# Patient Record
Sex: Female | Born: 1985 | Race: White | Hispanic: No | Marital: Married | Smoking: Never smoker
Health system: Southern US, Community
[De-identification: ages and names within clinical notes are randomized; demographics above are authoritative.]

## PROBLEM LIST (undated history)

## (undated) DIAGNOSIS — Z87442 Personal history of urinary calculi: Secondary | ICD-10-CM

## (undated) DIAGNOSIS — F419 Anxiety disorder, unspecified: Secondary | ICD-10-CM

## (undated) DIAGNOSIS — Z8619 Personal history of other infectious and parasitic diseases: Secondary | ICD-10-CM

## (undated) DIAGNOSIS — M549 Dorsalgia, unspecified: Secondary | ICD-10-CM

## (undated) HISTORY — PX: WISDOM TOOTH EXTRACTION: SHX21

## (undated) HISTORY — DX: Personal history of urinary calculi: Z87.442

## (undated) HISTORY — DX: Dorsalgia, unspecified: M54.9

## (undated) HISTORY — DX: Personal history of other infectious and parasitic diseases: Z86.19

---

## 2014-05-16 ENCOUNTER — Telehealth: Payer: Self-pay | Admitting: *Deleted

## 2014-05-16 ENCOUNTER — Ambulatory Visit (INDEPENDENT_AMBULATORY_CARE_PROVIDER_SITE_OTHER): Payer: BLUE CROSS/BLUE SHIELD

## 2014-05-16 ENCOUNTER — Ambulatory Visit (INDEPENDENT_AMBULATORY_CARE_PROVIDER_SITE_OTHER): Payer: BLUE CROSS/BLUE SHIELD | Admitting: Family Medicine

## 2014-05-16 VITALS — BP 110/66 | HR 83 | Temp 97.5°F | Resp 16 | Ht 67.5 in | Wt 198.4 lb

## 2014-05-16 DIAGNOSIS — Z3200 Encounter for pregnancy test, result unknown: Secondary | ICD-10-CM | POA: Diagnosis not present

## 2014-05-16 DIAGNOSIS — R05 Cough: Secondary | ICD-10-CM

## 2014-05-16 DIAGNOSIS — J209 Acute bronchitis, unspecified: Secondary | ICD-10-CM

## 2014-05-16 DIAGNOSIS — R059 Cough, unspecified: Secondary | ICD-10-CM

## 2014-05-16 LAB — POCT CBC
Granulocyte percent: 69.1 %G (ref 37–80)
HCT, POC: 43.4 % (ref 37.7–47.9)
Hemoglobin: 14.2 g/dL (ref 12.2–16.2)
Lymph, poc: 1.9 (ref 0.6–3.4)
MCH, POC: 27.9 pg (ref 27–31.2)
MCHC: 32.8 g/dL (ref 31.8–35.4)
MCV: 85.3 fL (ref 80–97)
MID (cbc): 0.4 (ref 0–0.9)
MPV: 8.5 fL (ref 0–99.8)
PLATELET COUNT, POC: 240 10*3/uL (ref 142–424)
POC Granulocyte: 5.2 (ref 2–6.9)
POC LYMPH PERCENT: 25 %L (ref 10–50)
POC MID %: 5.9 % (ref 0–12)
RBC: 5.09 M/uL (ref 4.04–5.48)
RDW, POC: 13.4 %
WBC: 7.5 10*3/uL (ref 4.6–10.2)

## 2014-05-16 LAB — D-DIMER, QUANTITATIVE (NOT AT ARMC): D DIMER QUANT: 0.27 ug{FEU}/mL (ref 0.00–0.48)

## 2014-05-16 LAB — POCT URINE PREGNANCY: Preg Test, Ur: NEGATIVE

## 2014-05-16 MED ORDER — HYDROCOD POLST-CHLORPHEN POLST 10-8 MG/5ML PO LQCR: 5.0000 mL | Freq: Two times a day (BID) | ORAL | Status: DC | PRN

## 2014-05-16 MED ORDER — AZITHROMYCIN 250 MG PO TABS: ORAL_TABLET | ORAL | Status: DC

## 2014-05-16 NOTE — Telephone Encounter (Signed)
Call report came in from [facility] regarding this pt.  D-dimer results are in Epic

## 2014-05-16 NOTE — Telephone Encounter (Signed)
Normal, called pt - she was very happy, will rtc if not improving.

## 2014-05-16 NOTE — Patient Instructions (Signed)

## 2014-05-16 NOTE — Progress Notes (Signed)
   Subjective:    Patient ID: Natalie Montgomery, female    DOB: 1985-12-08, 29 y.o.   MRN: [id number]  HPI    Review of Systems     Objective:  BP 110/66 mmHg  Pulse 83  Temp(Src) 97.5 F (36.4 C)  Resp 16  Ht 5' 7.5" (1.715 m)  Wt 198 lb 6.4 oz (89.994 kg)  BMI 30.60 kg/m2  SpO2 99%  LMP  (LMP Unknown)  Physical Exam       [facility] reading (PRIMARY) by  Dr. Clelia CroftShaw. CXR: no acute abnormality EKG: NSR, no ischemic changes Assessment & Plan:  Reviewed below resident note and discussed assessment and plan.  Agree w/ below.

## 2014-05-16 NOTE — Progress Notes (Signed)
   Subjective:    Patient ID: Natalie Montgomery, female    DOB: Oct 11, 1985, 29 y.o.   MRN: [id number]  HPI  Patient reports about 2 weeks of a "cold" with constant cough that began dry and is now becoming productive with greenish yellow lumps, no blood. She has had no dyspnea except when exertion makes her cough (walking yesterday). "Lungs" and right submandibular area hurt very badly x 4 days. Feels like there is a cut there that "rips" every time she swallows or coughs. She has had some subjective fever but that feels better today. Energy level is low. Is eating, drinking, urinating like normal. Denies diarrhea, nausea, vomiting, or abdominal pain. Tried nyquil and dayquil which upsets her stomach though it mildly helped with other symptoms. Has been around 652.30 year old (family member's cousin's child) who goes to daycare, though he had only mild cough. Has also had sneezing, rhinorrhea, neck pain. Denies neck stiffness or rash. No leg swelling, long car rides. She is on a [place] OCP. Sister died from PE.  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 NKDA.   Review of Systems - Per HPI     Objective:   Physical Exam BP 110/66 mmHg  Pulse 83  Temp(Src) 97.5 F (36.4 C)  Resp 16  Ht 5' 7.5" (1.715 m)  Wt 198 lb 6.4 oz (89.994 kg)  BMI 30.60 kg/m2  SpO2 99%  LMP  (LMP Unknown)  GEN: NAD, pleasant CV: RRR, no m/r/g, 2+ B radial pulses PULM: Mild RUL>RLL crackles, normal effort, occasional dry-sounding cough ABD: S/NT EXTR: No LE edema or calf tenderness HEENT: AT/[place], sclera clear, EOMI, PERRL, o/p clear with no purulence or erythema, TMs mildly hazy bilaterally with no bulging or obvious purulence, nares patent with mild dry rhinorrhea, neck supple, no obvious LAD but right submandibular area tenderness, increased maxillary sinus pressure to palpation    Assessment  & Plan:   49F with 2 weeks of cough that has become productive with rhinorrhea, mild sinus pain, fatigue/malaise and subjective fever. Sounds like possible viral URI that has developed into CAP now by symptoms and timecourse. Wells criteria is 0 but sister who died with PE and pt on OCP. CENTOR criteria 1 for tender LAD. No wheezes or hx to raise concern for asthma. - DG chest 2 view - POC CBC>>>WNL - EKG for any signs of strain - Will start patient on 5 day course of azithromycin and have her f/u in 3-5 days or sooner if any worsened shortness of breath.  - No PCP - Hydration, rest discussed.

## 2014-08-14 ENCOUNTER — Ambulatory Visit (INDEPENDENT_AMBULATORY_CARE_PROVIDER_SITE_OTHER): Payer: BLUE CROSS/BLUE SHIELD | Admitting: Physician Assistant

## 2014-08-14 VITALS — BP 112/74 | HR 90 | Temp 97.9°F | Resp 17 | Ht 68.0 in | Wt 191.2 lb

## 2014-08-14 DIAGNOSIS — J309 Allergic rhinitis, unspecified: Secondary | ICD-10-CM | POA: Diagnosis not present

## 2014-08-14 DIAGNOSIS — R0981 Nasal congestion: Secondary | ICD-10-CM

## 2014-08-14 DIAGNOSIS — J0100 Acute maxillary sinusitis, unspecified: Secondary | ICD-10-CM

## 2014-08-14 DIAGNOSIS — J029 Acute pharyngitis, unspecified: Secondary | ICD-10-CM

## 2014-08-14 DIAGNOSIS — L299 Pruritus, unspecified: Secondary | ICD-10-CM | POA: Diagnosis not present

## 2014-08-14 MED ORDER — AMOXICILLIN-POT CLAVULANATE 875-125 MG PO TABS: 1.0000 | ORAL_TABLET | Freq: Two times a day (BID) | ORAL | Status: DC

## 2014-08-14 NOTE — Patient Instructions (Signed)
For your allergies, please take claritin or zyrtec once daily for next few weeks. Please use flonase nasal spray two sprays in each nostril once daily for next few weeks.  You likely have a sinus infection at this time, please take the augmentin twice daily for 10 days. I've sent a throat culture and will let you know if there is anything concerning with this.  Allergic Rhinitis Allergic rhinitis is when the mucous membranes in the nose respond to allergens. Allergens are particles in the air that cause your body to have an allergic reaction. This causes you to release allergic antibodies. Through a chain of events, these eventually cause you to release histamine into the blood stream. Although meant to protect the body, it is this release of histamine that causes your discomfort, such as frequent sneezing, congestion, and an itchy, runny nose.  CAUSES  Seasonal allergic rhinitis (hay fever) is caused by pollen allergens that may come from grasses, trees, and weeds. Year-round allergic rhinitis (perennial allergic rhinitis) is caused by allergens such as house dust mites, pet dander, and mold spores.  SYMPTOMS   Nasal stuffiness (congestion).  Itchy, runny nose with sneezing and tearing of the eyes. DIAGNOSIS  Your health care provider can help you determine the allergen or allergens that trigger your symptoms. If you and your health care provider are unable to determine the allergen, skin or blood testing may be used. TREATMENT  Allergic rhinitis does not have a cure, but it can be controlled by:  Medicines and allergy shots (immunotherapy).  Avoiding the allergen. Hay fever may often be treated with antihistamines in pill or nasal spray forms. Antihistamines block the effects of histamine. There are over-the-counter medicines that may help with nasal congestion and swelling around the eyes. Check with your health care provider before taking or giving this medicine.  If avoiding the allergen  or the medicine prescribed do not work, there are many new medicines your health care provider can prescribe. Stronger medicine may be used if initial measures are ineffective. Desensitizing injections can be used if medicine and avoidance does not work. Desensitization is when a patient is given ongoing shots until the body becomes less sensitive to the allergen. Make sure you follow up with your health care provider if problems continue. HOME CARE INSTRUCTIONS It is not possible to completely avoid allergens, but you can reduce your symptoms by taking steps to limit your exposure to them. It helps to know exactly what you are allergic to so that you can avoid your specific triggers. SEEK MEDICAL CARE IF:   You have a fever.  You develop a cough that does not stop easily (persistent).  You have shortness of breath.  You start wheezing.  Symptoms interfere with normal daily activities. Document Released: 10/29/2000 Document Revised: 02/08/2013 Document Reviewed: 10/11/2012 [facility] Patient Information 2015 [place]. This information is not intended to replace advice given to you by your health care provider. Make sure you discuss any questions you have with your health care provider.

## 2014-08-14 NOTE — Progress Notes (Signed)
   Subjective:    Patient ID: Natalie Montgomery, female    DOB: 10/30/1985, 29 y.o.   MRN: [id number]  Chief Complaint  Patient presents with  . Sore Throat    so sore cant swallow, lump in throat   . Cough  . Nasal Congestion  . Ear Fullness    pa states they are clogged    There are no active problems to display for this patient.  Medications, allergies, past medical history, surgical history, family history, social history and problem list reviewed and updated.  HPI  [age] yof presents with above sx.   Has felt congested past couple wks. Head and ears. Past 4-5 days has had worsening st. Hurts to swallow. Denies cough. Felt warm past few days but did not check temp. Denies chills. Denies abd pain, n/v, diarrhea.   Has been taking ibuprofen at home but did not take today. Denies drooling, trouble breathing, able to tolerate foods and liquids ok. Denies sick contacts.   Does mention her ears have been itchy bilaterally. Denies itchy eyes.   Review of Systems See HPI.     Objective:   Physical Exam  Constitutional: She appears well-developed and well-nourished.  Non-toxic appearance. She does not have a sickly appearance. She does not appear ill. No distress.  BP 112/74 mmHg  Pulse 90  Temp(Src) 97.9 F (36.6 C) (Oral)  Resp 17  Ht 5\' 8"  (1.727 m)  Wt 191 lb 3.2 oz (86.728 kg)  BMI 29.08 kg/m2  SpO2 99%  LMP 01/17/2014   HENT:  Right Ear: A middle ear effusion is present.  Left Ear: A middle ear effusion is present.  Nose: Mucosal edema and rhinorrhea present. Right sinus exhibits maxillary sinus tenderness. Right sinus exhibits no frontal sinus tenderness. Left sinus exhibits maxillary sinus tenderness. Left sinus exhibits no frontal sinus tenderness.  Mouth/Throat: Uvula is midline, oropharynx is clear and moist and mucous membranes are normal.  Severe ttp bilateral maxillary sinuses  Pulmonary/Chest: Effort normal and breath sounds normal. No tachypnea.    Lymphadenopathy:       Head (right side): Submandibular adenopathy present.       Head (left side): Submandibular adenopathy present.    She has cervical adenopathy.       Right cervical: Superficial cervical adenopathy present.       Left cervical: Superficial cervical adenopathy present.      Assessment & Plan:   [age] yof presents with above sx.   Sore throat - Plan: Throat culture Loney Loh([facility]) Allergic rhinitis, unspecified allergic rhinitis type Head congestion Ear itching Acute maxillary sinusitis, recurrence not specified - Plan: amoxicillin-clavulanate (AUGMENTIN) 875-125 MG per tablet --suspect congestion, itching from poorly treated allergic rhinits --> zyrtec-d, flonase --sinus congestion for 2 wks with severe sinus ttp --> tx with augmentin for sinusitis --throat exam normal but cx sent to ensure no atypical pathology in this 29 yo pt  Donnajean Lopesodd M. Court Gracia, PA-C Physician Assistant-Certified [facility]  08/15/2014 8:42 AM

## 2014-08-15 LAB — CULTURE, GROUP A STREP: ORGANISM ID, BACTERIA: NORMAL

## 2015-08-04 DIAGNOSIS — F410 Panic disorder [episodic paroxysmal anxiety] without agoraphobia: Secondary | ICD-10-CM | POA: Diagnosis not present

## 2015-08-27 DIAGNOSIS — F410 Panic disorder [episodic paroxysmal anxiety] without agoraphobia: Secondary | ICD-10-CM | POA: Diagnosis not present

## 2015-09-11 ENCOUNTER — Emergency Department (HOSPITAL_COMMUNITY): Payer: BLUE CROSS/BLUE SHIELD

## 2015-09-11 ENCOUNTER — Encounter (HOSPITAL_COMMUNITY): Payer: Self-pay | Admitting: Emergency Medicine

## 2015-09-11 ENCOUNTER — Emergency Department (HOSPITAL_COMMUNITY)
Admission: EM | Admit: 2015-09-11 | Discharge: 2015-09-11 | Disposition: A | Discharge status: Home / Self Care | Payer: BLUE CROSS/BLUE SHIELD | Attending: Emergency Medicine | Admitting: Emergency Medicine

## 2015-09-11 DIAGNOSIS — M545 Low back pain: Secondary | ICD-10-CM | POA: Diagnosis not present

## 2015-09-11 DIAGNOSIS — R339 Retention of urine, unspecified: Secondary | ICD-10-CM | POA: Insufficient documentation

## 2015-09-11 LAB — URINE MICROSCOPIC-ADD ON: WBC, UA: NONE SEEN WBC/hpf (ref 0–5)

## 2015-09-11 LAB — URINALYSIS, ROUTINE W REFLEX MICROSCOPIC
BILIRUBIN URINE: NEGATIVE
Glucose, UA: NEGATIVE mg/dL
KETONES UR: NEGATIVE mg/dL
Leukocytes, UA: NEGATIVE
Nitrite: NEGATIVE
PROTEIN: NEGATIVE mg/dL
Specific Gravity, Urine: 1.015 (ref 1.005–1.030)
pH: 7 (ref 5.0–8.0)

## 2015-09-11 LAB — POC URINE PREG, ED: PREG TEST UR: NEGATIVE

## 2015-09-11 MED ORDER — CYCLOBENZAPRINE HCL 10 MG PO TABS: 10.0000 mg | ORAL_TABLET | Freq: Three times a day (TID) | ORAL | 0 refills | Status: DC | PRN

## 2015-09-11 MED ORDER — KETOROLAC TROMETHAMINE 60 MG/2ML IM SOLN
30.0000 mg | Freq: Once | INTRAMUSCULAR | Status: AC
Start: 2015-09-11 — End: 2015-09-11
  Administered 2015-09-11: 30 mg via INTRAMUSCULAR
  Filled 2015-09-11: qty 2

## 2015-09-11 MED ORDER — DEXAMETHASONE SODIUM PHOSPHATE 10 MG/ML IJ SOLN
10.0000 mg | Freq: Once | INTRAMUSCULAR | Status: AC
  Administered 2015-09-11: 10 mg via INTRAMUSCULAR
  Filled 2015-09-11: qty 1

## 2015-09-11 MED ORDER — MELOXICAM 15 MG PO TABS: 15.0000 mg | ORAL_TABLET | Freq: Every day | ORAL | 0 refills | Status: DC

## 2015-09-11 NOTE — ED Notes (Signed)
Family at bedside. 

## 2015-09-11 NOTE — ED Notes (Signed)
Pt verbalized understanding of discharge instructions and follow-up care. Denies further questions at this time. Ambulated without difficulty to the lobby.

## 2015-09-11 NOTE — Discharge Instructions (Signed)
Take the meloxicam and Flexeril as prescribed. Be sure to eat while taking meloxicam as it can be hard in your stomach. Do not drive or operate machinery while taking Flexeril. Do not drink alcohol while taking Flexeril. Follow up with a primary care provider or back here at the emergency department if your symptoms do not improve. Return to emergency department if you experience numbness/tingling, weakness, loss of bowel or bladder function, fevers or any other concerning symptoms.

## 2015-09-11 NOTE — ED Triage Notes (Signed)
Pt c/o severe lower back pain and urinary retention today

## 2015-09-11 NOTE — ED Provider Notes (Signed)
[facility]-EMERGENCY DEPT Provider Note   CSN: [id number] Arrival date & time: 09/11/15  1804  First Provider Contact:  First MD Initiated Contact with Patient 09/11/15 2100        History   Chief Complaint Chief Complaint  Patient presents with  . Back Pain  . Urinary Retention    HPI Natalie Montgomery is a 30 y.o. female.  HPI  Patient is a 30 year old female with a history of chronic back pain who presents the ED with sudden onset lower back pain since yesterday afternoon. She states was mowing the grass and turned around and felt a sudden pain in her lower back and sacrum that radiates into her bilateral lateral legs. At rest the pain is achy, 6/10. Movements make the pain worse which is sharp/stabbing, 8/10. Patient tried to tramadol and Flexeril with no relief. Patient states she had difficulty urinating. She urinated once today at noon and again this evening around 9:00. Associated nausea. Patient denies numbness/tingling, weakness, saddle anesthesia, loss of bowel or bladder function, fever, chills, nausea, vomiting, abdominal pain, headache, dizziness.  Patient states she had imaging done in [place] but has not had an MRI of her back.  History reviewed. No pertinent past medical history.  There are no active problems to display for this patient.   History reviewed. No pertinent surgical history.  OB History    No data available       Home Medications    Prior to Admission medications   Medication Sig Start Date End Date Taking? Authorizing Provider  ibuprofen (ADVIL,MOTRIN) 200 MG tablet Take 200 mg by mouth every 6 (six) hours as needed.   Yes Historical Provider, MD  Levonorgestrel (SKYLA) 13.5 MG IUD by Intrauterine route.   Yes Historical Provider, MD  traMADol (ULTRAM) 50 MG tablet Take 100 mg by mouth once.   Yes Historical Provider, MD  cyclobenzaprine (FLEXERIL) 10 MG tablet Take 1 tablet (10 mg total) by mouth 3 (three) times daily as needed for muscle spasms.  09/11/15   Jerre Simon, PA  meloxicam (MOBIC) 15 MG tablet Take 1 tablet (15 mg total) by mouth daily. 09/11/15   Jerre Simon, PA    Family History Family History  Problem Relation Age of Onset  . Hypertension Mother   . Hypertension Father   . Diabetes Maternal Grandmother   . Heart disease Maternal Grandfather     Social History Social History  Substance Use Topics  . Smoking status: Never Smoker  . Smokeless tobacco: Not on file  . Alcohol use 0.6 oz/week    1 Standard drinks or equivalent per week     Allergies   Review of patient's allergies indicates no known allergies.   Review of Systems Review of Systems  Constitutional: Negative for chills and fever.  Respiratory: Negative for shortness of breath.   Cardiovascular: Negative for chest pain.  Gastrointestinal: Negative for abdominal pain, nausea and vomiting.  Genitourinary: Positive for decreased urine volume and difficulty urinating. Negative for dysuria, flank pain, hematuria and vaginal discharge.  Musculoskeletal: Positive for back pain. Negative for neck pain and neck stiffness.  Skin: Negative for rash.  Neurological: Negative for dizziness, weakness, numbness and headaches.     Physical Exam Updated Vital Signs BP 101/64   Pulse 65   Temp 98.1 F (36.7 C) (Oral)   Resp 20   Ht 5\' 7"  (1.702 m)   Wt 86.2 kg   SpO2 97%   BMI 29.76 kg/m  Physical Exam  Physical Exam  Constitutional: Pt appears well-developed and well-nourished. No distress.  HENT:  Head: Normocephalic and atraumatic.  Mouth/Throat: Oropharynx is clear and moist. No oropharyngeal exudate.  Eyes: Conjunctivae are normal.  Neck: Normal range of motion. Neck supple.  Full ROM without pain  Cardiovascular: Normal rate, regular rhythm and intact distal pulses including 2+ DP pulses.   Pulmonary/Chest: Effort normal and breath sounds normal. No respiratory distress. Pt has no wheezes.  Abdominal: Soft. Normal bowel sounds,  Pt exhibits no distension. There is no tenderness.  Musculoskeletal:  Full range of motion of the T-spine and L-spine No tenderness to palpation of the spinous processes of the T-spine or L-spine Mild tenderness to palpation of the paraspinous muscles of the L-spine  and the SI joints bilaterally Pain elicited with straight leg raise on the right side, full range of motion of the hips and knees, patient's or vascular intact distally  Speech is clear and goal oriented, follows commands Normal 5/5 strength in lower extremities bilaterally including dorsiflexion and plantar flexion  Sensation normal to light touch Moves extremities without ataxia, coordination intact, negative pronator drift  Normal gait Normal balance No Clonus  Skin: Skin is warm and dry. No rash noted. Pt is not diaphoretic. No erythema.  Psychiatric: Pt has a normal mood and affect. Behavior is normal.  Nursing note and vitals reviewed.    ED Treatments / Results  Labs (all labs ordered are listed, but only abnormal results are displayed) Labs Reviewed  URINALYSIS, ROUTINE W REFLEX MICROSCOPIC (NOT AT [facility]) - Abnormal; Notable for the following:       Result Value   APPearance CLOUDY (*)    Hgb urine dipstick TRACE (*)    All other components within normal limits  URINE MICROSCOPIC-ADD ON - Abnormal; Notable for the following:    Squamous Epithelial / LPF 6-30 (*)    Bacteria, UA RARE (*)    All other components within normal limits  POC URINE PREG, ED  POC URINE PREG, ED    EKG  EKG Interpretation None       Radiology Dg Lumbar Spine Complete  Result Date: 09/11/2015 CLINICAL DATA:  Chronic low back pain radiates down right leg. Worsened yesterday. EXAM: LUMBAR SPINE - COMPLETE 4+ VIEW COMPARISON:  None. FINDINGS: There is no evidence of lumbar spine fracture. Alignment is normal. Intervertebral disc spaces are maintained. IMPRESSION: Negative. Electronically Signed   By: Kennith Center M.D.   On:  09/11/2015 21:52   Procedures Procedures (including critical care time)  Medications Ordered in ED Medications  ketorolac (TORADOL) injection 30 mg (30 mg Intramuscular Given 09/11/15 2204)  dexamethasone (DECADRON) injection 10 mg (10 mg Intramuscular Given 09/11/15 2336)     Initial Impression / Assessment and Plan / ED Course  I have reviewed the triage vital signs and the nursing notes.  Pertinent labs & imaging results that were available during my care of the patient were reviewed by me and considered in my medical decision making (see chart for details).  Clinical Course   11:15pm Pt pain improved with Toradol. Will give a dose of IM Decadron and d/c home   Final Clinical Impressions(s) / ED Diagnoses   Final diagnoses:  Bilateral low back pain, with sciatica presence unspecified  Urinary retention   Patient with back pain similar to episodes she's had in the past.  No neurological deficits and normal neuro exam.  Patient can walk but states is painful.  No loss  of bowel or bladder control. Patient did endorse urinary retention. She did void while in the ED and her bladder scan was roughly around 160 prior to voiding.  No concern for cauda equina.  No fever, night sweats, weight loss, h/o cancer, IVDU.  X-rays negative for any acute abnormality. RICE protocol and pain medicine indicated and discussed with patient. Patient pain control the ED with IM Toradol. We'll administer a dose of IM Decadron and discharged patient with pain medication of Flexeril. Instructed the patient to obtain a primary care provider and follow up with them regarding her back pain. Also suggested she see a spine specialist for her chronic back pain. Discussed strict return precautions. Patient and family expressed understanding to the discharge instructions.   New Prescriptions Discharge Medication List as of 09/11/2015 11:27 PM    START taking these medications   Details  meloxicam (MOBIC) 15 MG  tablet Take 1 tablet (15 mg total) by mouth daily., Starting Tue 09/11/2015, Print         Joyce Copa [place], [place] 09/11/15 4540    Rolan Bucco, MD 09/11/15 2359

## 2015-11-05 ENCOUNTER — Encounter: Payer: Self-pay | Admitting: Family

## 2015-11-05 ENCOUNTER — Ambulatory Visit (INDEPENDENT_AMBULATORY_CARE_PROVIDER_SITE_OTHER): Payer: BLUE CROSS/BLUE SHIELD | Admitting: Family

## 2015-11-05 VITALS — BP 110/80 | HR 89 | Temp 98.5°F | Resp 16 | Ht 67.0 in | Wt 210.0 lb

## 2015-11-05 DIAGNOSIS — Z23 Encounter for immunization: Secondary | ICD-10-CM | POA: Diagnosis not present

## 2015-11-05 DIAGNOSIS — M461 Sacroiliitis, not elsewhere classified: Secondary | ICD-10-CM | POA: Insufficient documentation

## 2015-11-05 MED ORDER — IBUPROFEN-FAMOTIDINE 800-26.6 MG PO TABS: 1.0000 | ORAL_TABLET | Freq: Three times a day (TID) | ORAL | 1 refills | Status: DC | PRN

## 2015-11-05 MED ORDER — DICLOFENAC SODIUM 2 % TD SOLN: 1.0000 "application " | Freq: Two times a day (BID) | TRANSDERMAL | 1 refills | Status: DC | PRN

## 2015-11-05 NOTE — Patient Instructions (Signed)
Thank you for choosing [facility]!  Ice / moist heat x 20 minutes every 2 hours and as needed or following activity  Pennsaid - Approximately 1/2 packet to the affected site twice daily.  Duexis - 1 tablet 3 times per day for the next 5-7 days and then as needed.  Exercises 1-2 times per day as instructed.   You will receive a call from [facility] regarding your Pennsaid/Duexis/Vimovo. The medication will be mailed to you and should cost you no more than $10 per item or possibly free depending upon your insurance.   Your prescription(s) have been submitted to your pharmacy or been printed and provided for you. Please take as directed and contact our office if you believe you are having problem(s) with the medication(s) or have any questions.  If your symptoms worsen or fail to improve, please contact our office for further instruction, or in case of emergency go directly to the emergency room at the closest medical facility.   Sacroiliac Joint Dysfunction Sacroiliac joint dysfunction is a condition that causes inflammation on one or both sides of the sacroiliac (SI) joint. The SI joint connects the lower part of the spine (sacrum) with the two upper portions of the pelvis (ilium). This condition causes deep aching or burning pain in the low back. In some cases, the pain may also spread into one or both buttocks or hips or spread down the legs. CAUSES This condition may be caused by:  Pregnancy. During pregnancy, extra stress is put on the SI joints because the pelvis widens.  Injury, such as:  Car accidents.  Sport-related injuries.  Work-related injuries.  Having one leg that is shorter than the other.  Conditions that affect the joints, such as:  Rheumatoid arthritis.  Gout.  Psoriatic arthritis.  Joint infection (septic arthritis). Sometimes, the cause of SI joint dysfunction is not known. SYMPTOMS Symptoms of this condition include:  Aching or burning  pain in the lower back. The pain may also spread to other areas, such as:  Buttocks.  Groin.  Thighs and legs.  Muscle spasms in or around the painful areas.  Increased pain when standing, walking, running, stair climbing, bending, or lifting. DIAGNOSIS Your health care provider will do a physical exam and take your medical history. During the exam, the health care provider may move one or both of your legs to different positions to check for pain. Various tests may be done to help verify the diagnosis, including:  Imaging tests to look for other causes of pain. These may include:  MRI.  CT scan.  Bone scan.  Diagnostic injection. A numbing medicine is injected into the SI joint using a needle. If the pain is temporarily improved or stopped after the injection, this can indicate that SI joint dysfunction is the problem. TREATMENT Treatment may vary depending on the cause and severity of your condition. Treatment options may include:  Applying ice or heat to the lower back area. This can help to reduce pain and muscle spasms.  Medicines to relieve pain or inflammation or to relax the muscles.  Wearing a back brace (sacroiliac brace) to help support the joint while your back is healing.  Physical therapy to increase muscle strength around the joint and flexibility at the joint. This may also involve learning proper body positions and ways of moving to relieve stress on the joint.  Direct manipulation of the SI joint.  Injections of steroid medicine into the joint in order to reduce pain  and swelling.  Radiofrequency ablation to burn away nerves that are carrying pain messages from the joint.  Use of a device that provides electrical stimulation in order to reduce pain at the joint.  Surgery to put in screws and plates that limit or prevent joint motion. This is rare. HOME CARE INSTRUCTIONS  Rest as needed. Limit your activities as directed by your health care  provider.  Take medicines only as directed by your health care provider.  If directed, apply ice to the affected area:  Put ice in a plastic bag.  Place a towel between your skin and the bag.  Leave the ice on for 20 minutes, 2-3 times per day.  Use a heating pad or a moist heat pack as directed by your health care provider.  Exercise as directed by your health care provider or physical therapist.  Keep all follow-up visits as directed by your health care provider. This is important. SEEK MEDICAL CARE IF:  Your pain is not controlled with medicine.  You have a fever.  You have increasingly severe pain. SEEK IMMEDIATE MEDICAL CARE IF:  You have weakness, numbness, or tingling in your legs or feet.  You lose control of your bladder or bowel.   This information is not intended to replace advice given to you by your health care provider. Make sure you discuss any questions you have with your health care provider.   Document Released: 05/02/2008 Document Revised: 06/20/2014 Document Reviewed: 10/11/2013 Elsevier Interactive Patient Education [employer]

## 2015-11-05 NOTE — Assessment & Plan Note (Signed)
Symptoms and exam consistent with sacroiliac joint and pronation. Treat conservatively with ice/moist heat, home exercise therapy, and start Pennsaid and Duexis. Previous x-rays reviewed with no significant findings. Most likely related to muscle tightness. If symptoms worsen or do not improve consider physical therapy and further imaging if necessary.
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
## 2016-01-19 DIAGNOSIS — R05 Cough: Secondary | ICD-10-CM | POA: Diagnosis not present

## 2016-01-19 DIAGNOSIS — J069 Acute upper respiratory infection, unspecified: Secondary | ICD-10-CM | POA: Diagnosis not present

## 2016-04-23 DIAGNOSIS — M4716 Other spondylosis with myelopathy, lumbar region: Secondary | ICD-10-CM | POA: Diagnosis not present

## 2016-05-06 ENCOUNTER — Telehealth: Payer: Self-pay | Admitting: Family

## 2016-05-06 NOTE — Telephone Encounter (Signed)
TELEPHONE ADVICE RECORD [facility]  Patient Name: Natalie SlickerNNA Ben  DOB: 01-Apr-1985    Initial Comment Caller's hands are very cold, going purple.    Nurse Assessment  Nurse: Odis LusterBowers, RN, Bjorn Loserhonda Date/Time Lamount Cohen(Eastern Time): 05/06/2016 3:10:28 PM  Confirm and document reason for call. If symptomatic, describe symptoms. ---Caller's hands are very cold, going purple. Reports that this has been happening for a couple of weeks. Denies swelling, but she has a hard time gripping things and they go numb as she works. 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 Has seen someone about her back who thinks that this might be coming from her neck.  Does the patient have any new or worsening symptoms? ---Yes  Will a triage be completed? ---Yes  Related visit to physician within the last 2 weeks? ---No  Does the PT have any chronic conditions? (i.e. diabetes, asthma, etc.) ---Yes  List chronic conditions. ---back/neck problems  Is the patient pregnant or possibly pregnant? (Ask all females between the ages of 6412-55) ---No  Is this a behavioral health or substance abuse call? ---No     Guidelines    Guideline Title Affirmed Question Affirmed Notes       Final Disposition User        Comments  Caller has a scheduled appt with Natalie Montgomery tomorrow at the [place] office at 3:30p.

## 2016-05-07 ENCOUNTER — Other Ambulatory Visit (INDEPENDENT_AMBULATORY_CARE_PROVIDER_SITE_OTHER): Payer: BLUE CROSS/BLUE SHIELD

## 2016-05-07 ENCOUNTER — Encounter: Payer: Self-pay | Admitting: Nurse Practitioner

## 2016-05-07 ENCOUNTER — Ambulatory Visit (INDEPENDENT_AMBULATORY_CARE_PROVIDER_SITE_OTHER): Payer: BLUE CROSS/BLUE SHIELD | Admitting: Nurse Practitioner

## 2016-05-07 VITALS — BP 128/84 | HR 84 | Temp 98.4°F | Ht 67.0 in | Wt 208.0 lb

## 2016-05-07 DIAGNOSIS — M5412 Radiculopathy, cervical region: Secondary | ICD-10-CM | POA: Diagnosis not present

## 2016-05-07 DIAGNOSIS — R202 Paresthesia of skin: Secondary | ICD-10-CM

## 2016-05-07 DIAGNOSIS — R200 Anesthesia of skin: Secondary | ICD-10-CM | POA: Diagnosis not present

## 2016-05-07 LAB — SEDIMENTATION RATE: Sed Rate: 5 mm/hr (ref 0–20)

## 2016-05-07 LAB — BASIC METABOLIC PANEL
BUN: 8 mg/dL (ref 6–23)
CALCIUM: 9.7 mg/dL (ref 8.4–10.5)
CHLORIDE: 104 meq/L (ref 96–112)
CO2: 29 meq/L (ref 19–32)
CREATININE: 0.56 mg/dL (ref 0.40–1.20)
GFR: 133.42 mL/min (ref >60.00)
Glucose, Bld: 93 mg/dL (ref 70–99)
Potassium: 3.7 mEq/L (ref 3.5–5.1)
Sodium: 138 mEq/L (ref 135–145)

## 2016-05-07 LAB — TSH: TSH: 0.93 u[IU]/mL (ref 0.35–4.50)

## 2016-05-07 NOTE — Progress Notes (Signed)
Subjective:  Patient ID: Natalie Montgomery, female    DOB: [birth year]  Age: 31 y.o. MRN: [id number]  CC: Hand Pain (both hand tension,cold,numbness,pain)   HPI Bilateral UE numbness and tingling: Ongoing for x 2 months, waxing and waning. Denies any head or neck injury. Symptoms are worse when laying down. Has not tried any treatment at this time. evaluated by neurosurgery 04/23/2016 who recommended MRI cervical spine. Pending pre cert.  Outpatient Medications Prior to Visit  Medication Sig Dispense Refill  . ibuprofen (ADVIL,MOTRIN) 200 MG tablet Take 200 mg by mouth every 6 (six) hours as needed.    . Ibuprofen-Famotidine 800-26.6 MG TABS Take 1 tablet by mouth 3 (three) times daily as needed. 90 tablet 1  . Levonorgestrel (SKYLA) 13.5 MG IUD by Intrauterine route.    . Diclofenac Sodium (PENNSAID) 2 % SOLN Place 1 application onto the skin 2 (two) times daily as needed. (Patient not taking: Reported on 05/07/2016) 112 g 1   No facility-administered medications prior to visit.     ROS See HPI  Objective:  BP 128/84   Pulse 84   Temp 98.4 F (36.9 C)   Ht '5\' 7"'$  (1.702 m)   Wt 208 lb (94.3 kg)   SpO2 99%   BMI 32.58 kg/m   BP Readings from Last 3 Encounters:  05/07/16 128/84  11/05/15 110/80  09/11/15 103/75    Wt Readings from Last 3 Encounters:  05/07/16 208 lb (94.3 kg)  11/05/15 210 lb (95.3 kg)  09/11/15 190 lb (86.2 kg)    Physical Exam  Constitutional: She is oriented to person, place, and time. No distress.  Eyes: No scleral icterus.  Neck: Normal range of motion. Neck supple. No thyromegaly present.  Cardiovascular: Normal rate and normal heart sounds.   Pulmonary/Chest: Effort normal and breath sounds normal.  Musculoskeletal: Normal range of motion. She exhibits no edema or tenderness.  Lymphadenopathy:    She has no cervical adenopathy.  Neurological: She is alert and oriented to person, place, and time. Coordination normal.  Skin: Skin is warm and  dry.  Vitals reviewed.   Lab Results  Component Value Date   WBC 7.5 05/16/2014   HGB 14.2 05/16/2014   HCT 43.4 05/16/2014   GLUCOSE 93 05/07/2016   NA 138 05/07/2016   K 3.7 05/07/2016   CL 104 05/07/2016   CREATININE 0.56 05/07/2016   BUN 8 05/07/2016   CO2 29 05/07/2016   TSH 0.93 05/07/2016    Dg Lumbar Spine Complete  Result Date: 09/11/2015 CLINICAL DATA:  Chronic low back pain radiates down right leg. Worsened yesterday. EXAM: LUMBAR SPINE - COMPLETE 4+ VIEW COMPARISON:  None. FINDINGS: There is no evidence of lumbar spine fracture. Alignment is normal. Intervertebral disc spaces are maintained. IMPRESSION: Negative. Electronically Signed   By: Misty Stanley M.D.   On: 09/11/2015 21:52   Assessment & Plan:   [name] was seen today for hand pain.  Diagnoses and all orders for this visit:  Cervical radiculopathy -     Antinuclear Antib (ANA); Future -     Rheumatoid Factor; Future -     Sed Rate (ESR); Future -     TSH; Future -     Basic metabolic panel; Future  Numbness and tingling in both hands -     Antinuclear Antib (ANA); Future -     Rheumatoid Factor; Future -     Sed Rate (ESR); Future -     TSH; Future -  Basic metabolic panel; Future   I am having Ms. Hamor maintain her ibuprofen, Levonorgestrel, Diclofenac Sodium, and Ibuprofen-Famotidine.  No orders of the defined types were placed in this encounter.   Follow-up: Return in about 1 month (around 06/07/2016) for forearm paresthesia (with greg calone).  Wilfred Lacy, NP

## 2016-05-07 NOTE — Patient Instructions (Signed)
Please follow up with neurosurgery: [facility] Barnett [facility]Henry [facility]  Will prescribe gabapentin if BMP normal.  You will be called with lab results.

## 2016-05-07 NOTE — Progress Notes (Signed)
Pre visit review using our clinic review tool, if applicable. No additional management support is needed unless otherwise documented below in the visit note. 

## 2016-05-08 LAB — ANA: Anti Nuclear Antibody(ANA): NEGATIVE

## 2016-05-08 LAB — RHEUMATOID FACTOR

## 2016-05-08 NOTE — Progress Notes (Signed)
Normal results, see office note

## 2016-05-09 DIAGNOSIS — M4802 Spinal stenosis, cervical region: Secondary | ICD-10-CM | POA: Diagnosis not present

## 2016-05-09 DIAGNOSIS — M4716 Other spondylosis with myelopathy, lumbar region: Secondary | ICD-10-CM | POA: Diagnosis not present

## 2016-05-15 DIAGNOSIS — G959 Disease of spinal cord, unspecified: Secondary | ICD-10-CM | POA: Diagnosis not present

## 2016-06-23 ENCOUNTER — Ambulatory Visit (INDEPENDENT_AMBULATORY_CARE_PROVIDER_SITE_OTHER): Payer: BLUE CROSS/BLUE SHIELD | Admitting: Internal Medicine

## 2016-06-23 DIAGNOSIS — J40 Bronchitis, not specified as acute or chronic: Secondary | ICD-10-CM

## 2016-06-23 MED ORDER — BENZONATATE 200 MG PO CAPS: 200.0000 mg | ORAL_CAPSULE | Freq: Three times a day (TID) | ORAL | 0 refills | Status: DC | PRN

## 2016-06-23 MED ORDER — HYDROCODONE-HOMATROPINE 5-1.5 MG/5ML PO SYRP: 5.0000 mL | ORAL_SOLUTION | Freq: Three times a day (TID) | ORAL | 0 refills | Status: DC | PRN

## 2016-06-23 MED ORDER — PREDNISONE 20 MG PO TABS: 40.0000 mg | ORAL_TABLET | Freq: Every day | ORAL | 0 refills | Status: DC

## 2016-06-23 NOTE — Progress Notes (Signed)
   Subjective:    Patient ID: Natalie Montgomery, female    DOB: 26-Mar-1985, 31 y.o.   MRN: [id number]  HPI The patient is a 31 YO female coming in for cold symptoms with cough and drainage. Started about 7-10 days ago and is overall worsening. She has tried otc cough medications which have not helped much. She is having some SOB and limitation of activity. Some fevers during this course. Also some fatigue. Overall symptoms worsening during the course. Denies ear pain. Having sinus pressure and headaches. No muscle aches or body aches.   Review of Systems  Constitutional: Positive for activity change, fatigue and fever. Negative for appetite change, chills and unexpected weight change.  HENT: Positive for congestion, postnasal drip, rhinorrhea and sinus pressure. Negative for ear discharge, ear pain, sinus pain, sore throat, trouble swallowing and voice change.   Eyes: Negative.   Respiratory: Positive for cough and shortness of breath. Negative for chest tightness and wheezing.   Cardiovascular: Negative.   Gastrointestinal: Negative.   Musculoskeletal: Negative.   Neurological: Negative.       Objective:   Physical Exam  Constitutional: She is oriented to person, place, and time. She appears well-developed and well-nourished.  HENT:  Head: Normocephalic and atraumatic.  Right Ear: External ear normal.  Left Ear: External ear normal.  Oropharynx with redness and drainage, nose with mild crusting, sinus pressure frontal.   Eyes: EOM are normal.  Neck: Normal range of motion.  Cardiovascular: Normal rate and regular rhythm.   Pulmonary/Chest: Effort normal. No respiratory distress. She has no wheezes. She has no rales.  Some rhonchi which partially clear with coughing  Abdominal: Soft.  Lymphadenopathy:    She has no cervical adenopathy.  Neurological: She is alert and oriented to person, place, and time.  Skin: Skin is warm and dry.   Vitals:   06/23/16 1549  BP: 140/78  Pulse: 98   Resp: 14  Temp: 98.7 F (37.1 C)  TempSrc: Oral  SpO2: 99%  Weight: 209 lb (94.8 kg)  Height: 5\' 7"  (1.702 m)      Assessment & Plan:

## 2016-06-23 NOTE — Patient Instructions (Signed)
We have sent in prednisone to take to clear the sinuses and lung. Take 2 pills daily for 5 days then stop.   We have sent in cough medicine to use at night time only.   We have also sent in tessalon perles that you can take up to 3 times per day which is non-drowsy.

## 2016-06-23 NOTE — Progress Notes (Signed)
Pre visit review using our clinic review tool, if applicable. No additional management support is needed unless otherwise documented below in the visit note. 

## 2016-06-24 DIAGNOSIS — J40 Bronchitis, not specified as acute or chronic: Secondary | ICD-10-CM | POA: Insufficient documentation

## 2016-06-24 NOTE — Assessment & Plan Note (Signed)
Rx for prednisone, tessalon perles and hycodan for symptom relief. Some rhonchi on exam with congestion.

## 2016-06-26 ENCOUNTER — Telehealth: Payer: Self-pay | Admitting: Internal Medicine

## 2016-06-26 MED ORDER — AZITHROMYCIN 250 MG PO TABS: ORAL_TABLET | ORAL | 0 refills | Status: DC

## 2016-06-26 NOTE — Telephone Encounter (Signed)
Azithromycin sent to [facility].

## 2016-06-26 NOTE — Telephone Encounter (Signed)
Patient states she seen Dr. Okey Duprerawford on 5/7 and was told to call back if not feeling any better.  Patient states she has gotten worse since then.  States she is still coughing through the night and is having ear pain.  Please follow up in regard.  Patient uses [facility] on [address].

## 2016-06-27 NOTE — Telephone Encounter (Signed)
Notified patient.

## 2016-09-03 ENCOUNTER — Telehealth: Payer: Self-pay | Admitting: *Deleted

## 2016-09-03 NOTE — Telephone Encounter (Signed)
Generally rabies vaccination would be the only thing to be concerned about if bitten. Otherwise there is nothing that is needed prophylactically.
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
## 2016-09-03 NOTE — Telephone Encounter (Signed)
Notified pt w/Greg response.../lmb 

## 2017-02-03 ENCOUNTER — Other Ambulatory Visit (INDEPENDENT_AMBULATORY_CARE_PROVIDER_SITE_OTHER): Payer: BLUE CROSS/BLUE SHIELD

## 2017-02-03 ENCOUNTER — Ambulatory Visit (INDEPENDENT_AMBULATORY_CARE_PROVIDER_SITE_OTHER)
Admission: RE | Admit: 2017-02-03 | Discharge: 2017-02-03 | Disposition: A | Discharge status: Home / Self Care | Payer: BLUE CROSS/BLUE SHIELD | Source: Ambulatory Visit | Attending: Internal Medicine | Admitting: Internal Medicine

## 2017-02-03 ENCOUNTER — Encounter: Payer: Self-pay | Admitting: Internal Medicine

## 2017-02-03 ENCOUNTER — Ambulatory Visit: Payer: Self-pay | Admitting: *Deleted

## 2017-02-03 ENCOUNTER — Ambulatory Visit: Payer: BLUE CROSS/BLUE SHIELD | Admitting: Internal Medicine

## 2017-02-03 VITALS — BP 120/80 | HR 89 | Temp 98.2°F | Ht 67.0 in | Wt 209.0 lb

## 2017-02-03 DIAGNOSIS — R42 Dizziness and giddiness: Secondary | ICD-10-CM

## 2017-02-03 DIAGNOSIS — M546 Pain in thoracic spine: Secondary | ICD-10-CM

## 2017-02-03 DIAGNOSIS — F419 Anxiety disorder, unspecified: Secondary | ICD-10-CM

## 2017-02-03 DIAGNOSIS — M549 Dorsalgia, unspecified: Secondary | ICD-10-CM | POA: Insufficient documentation

## 2017-02-03 DIAGNOSIS — R079 Chest pain, unspecified: Secondary | ICD-10-CM

## 2017-02-03 DIAGNOSIS — R0781 Pleurodynia: Secondary | ICD-10-CM | POA: Diagnosis not present

## 2017-02-03 LAB — BASIC METABOLIC PANEL
BUN: 9 mg/dL (ref 6–23)
CHLORIDE: 102 meq/L (ref 96–112)
CO2: 29 meq/L (ref 19–32)
CREATININE: 0.58 mg/dL (ref 0.40–1.20)
Calcium: 9.4 mg/dL (ref 8.4–10.5)
GFR: 127.53 mL/min (ref >60.00)
GLUCOSE: 88 mg/dL (ref 70–99)
Potassium: 3.9 mEq/L (ref 3.5–5.1)
Sodium: 138 mEq/L (ref 135–145)

## 2017-02-03 LAB — CBC WITH DIFFERENTIAL/PLATELET
BASOS ABS: 0.1 10*3/uL (ref 0.0–0.1)
Basophils Relative: 0.7 % (ref 0.0–3.0)
EOS PCT: 1.6 % (ref 0.0–5.0)
Eosinophils Absolute: 0.1 10*3/uL (ref 0.0–0.7)
HEMATOCRIT: 44.7 % (ref 36.0–46.0)
Hemoglobin: 15.3 g/dL — ABNORMAL HIGH (ref 12.0–15.0)
Lymphocytes Relative: 24.8 % (ref 12.0–46.0)
Lymphs Abs: 2 10*3/uL (ref 0.7–4.0)
MCHC: 34.3 g/dL (ref 30.0–36.0)
MCV: 85.3 fl (ref 78.0–100.0)
MONOS PCT: 7.5 % (ref 3.0–12.0)
Monocytes Absolute: 0.6 10*3/uL (ref 0.1–1.0)
Neutro Abs: 5.4 10*3/uL (ref 1.4–7.7)
Neutrophils Relative %: 65.4 % (ref 43.0–77.0)
Platelets: 266 10*3/uL (ref 150.0–400.0)
RBC: 5.23 Mil/uL — AB (ref 3.87–5.11)
RDW: 13.4 % (ref 11.5–15.5)
WBC: 8.2 10*3/uL (ref 4.0–10.5)

## 2017-02-03 LAB — HEPATIC FUNCTION PANEL
ALK PHOS: 35 U/L — AB (ref 39–117)
ALT: 23 U/L (ref 0–35)
AST: 15 U/L (ref 0–37)
Albumin: 4.5 g/dL (ref 3.5–5.2)
BILIRUBIN DIRECT: 0.1 mg/dL (ref 0.0–0.3)
BILIRUBIN TOTAL: 0.4 mg/dL (ref 0.2–1.2)
TOTAL PROTEIN: 8.2 g/dL (ref 6.0–8.3)

## 2017-02-03 MED ORDER — CYCLOBENZAPRINE HCL 5 MG PO TABS: 5.0000 mg | ORAL_TABLET | Freq: Three times a day (TID) | ORAL | 1 refills | Status: DC | PRN

## 2017-02-03 MED ORDER — ALPRAZOLAM 0.25 MG PO TABS: 0.2500 mg | ORAL_TABLET | Freq: Two times a day (BID) | ORAL | 0 refills | Status: DC | PRN

## 2017-02-03 MED ORDER — MELOXICAM 15 MG PO TABS: 15.0000 mg | ORAL_TABLET | Freq: Every day | ORAL | 3 refills | Status: DC | PRN

## 2017-02-03 NOTE — Telephone Encounter (Signed)
Patient is seeing Dr.JOHN today.

## 2017-02-03 NOTE — Patient Instructions (Addendum)
Your EKG was OK today  Please take all new medication as prescribed - the xanax as needed, and the antiinflammatory and muscle relaxer for pain  Please continue all other medications as before, and refills have been done if requested.  Please have the pharmacy call with any other refills you may need.  Please keep your appointments with your specialists as you may have planned  Please go to the XRAY Department in the Basement (go straight as you get off the elevator) for the x-ray testing  Please go to the LAB in the Basement (turn left off the elevator) for the tests to be done today  You will be contacted by phone if any changes need to be made immediately.  Otherwise, you will receive a letter about your results with an explanation, but please check with MyChart first.  Please remember to sign up for MyChart if you have not done so, as this will be important to you in the future with finding out test results, communicating by private email, and scheduling acute appointments online when needed.

## 2017-02-03 NOTE — Telephone Encounter (Signed)
Pt experiencing some stabbing chest pains that have been going on for a couple of weeks. She denies any cardiac symptoms. No distress voiced.  Appointment made  Reason for Disposition . [1] Chest pain lasting <= 5 minutes AND [2] NO chest pain or cardiac symptoms now(Exceptions: pains lasting a few seconds)  Answer Assessment - Initial Assessment Questions 1. LOCATION: "Where does it hurt?"       Towards the left side of chest 2. RADIATION: "Does the pain go anywhere else?" (e.g., into neck, jaw, arms, back)     Up toward the shoulder 3. ONSET: "When did the chest pain begin?" (Minutes, hours or days)      A couple of weeks 4. PATTERN "Does the pain come and go, or has it been constant since it started?"  "Does it get worse with exertion?"      Comes and goes. 5. DURATION: "How long does it last" (e.g., seconds, minutes, hours)     Stabbing pain lasts a few seconds and then the pain left can last up to an hour 6. SEVERITY: "How bad is the pain?"  (e.g., Scale 1-10; mild, moderate, or severe)    - MILD (1-3): doesn't interfere with normal activities     - MODERATE (4-7): interferes with normal activities or awakens from sleep    - SEVERE (8-10): excruciating pain, unable to do any normal activities       Moderate, #7 7. CARDIAC RISK FACTORS: "Do you have any history of heart problems or risk factors for heart disease?" (e.g., prior heart attack, angina; high blood pressure, diabetes, being overweight, high cholesterol, smoking, or strong family history of heart disease)     Dad and mom has HTN 8. PULMONARY RISK FACTORS: "Do you have any history of lung disease?"  (e.g., blood clots in lung, asthma, emphysema, birth control pills)     IUD, family hx of asthma, sister passed away from blood clot 9. CAUSE: "What do you think is causing the chest pain?"     Not sure 10. OTHER SYMPTOMS: "Do you have any other symptoms?" (e.g., dizziness, nausea, vomiting, sweating, fever, difficulty breathing,  cough)       no 11. PREGNANCY: "Is there any chance you are pregnant?" "When was your last menstrual period?"       No LMP last week  Protocols used: CHEST PAIN-A-AH

## 2017-02-03 NOTE — Assessment & Plan Note (Signed)
Suspect trelated to anxiety but ok for labs as ordered,  to f/u any worsening symptoms or concerns with expectant management, ecg reviewed

## 2017-02-03 NOTE — Assessment & Plan Note (Signed)
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

## 2017-02-03 NOTE — Progress Notes (Signed)
Subjective:    Patient ID: Natalie Montgomery, female    DOB: 1985/05/28, 31 y.o.   MRN: [id number]  HPI  Here with several complaints of 2-3 days stabbing sharp pleuritic left upper CP mild, intermittent without radiation, n/v, or diaphoresis.  Sister died at 32yo of DVT/PE and she is very concerned.  No fever, Pt denies wheezing, orthopnea, PND, increased LE edema, but has had mild palpitations, dizziness with anxiety.  Did have URi cold symptoms 2 wks ago but resolved and not using any otc meds.  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 Also c/o bilat costal margin pain with bending as well as bilat pain below the bilat scapulas in the back.   Past Medical History:  Diagnosis Date  . Back pain   . History of chicken pox   . History of kidney stones    No past surgical history on file.  reports that  has never smoked. she has never used smokeless tobacco. She reports that she drinks about 2.4 oz of alcohol per week. She reports that she does not use drugs. family history includes Diabetes in her mother; Heart disease in her maternal grandfather; Hypertension in her father and mother; Multiple sclerosis in her maternal grandmother; Pulmonary embolism in her sister. Allergies  Allergen Reactions  . Pecan Nut (Diagnostic)    Current Outpatient Medications on File Prior to Visit  Medication Sig Dispense Refill  . Diclofenac Sodium (PENNSAID) 2 % SOLN Place 1 application onto the skin 2 (two) times daily as needed. 112 g 1  . HYDROcodone-homatropine (HYCODAN) 5-1.5 MG/5ML syrup Take 5 mLs by mouth every 8 (eight) hours as needed for cough. 120 mL 0  . Ibuprofen-Famotidine 800-26.6 MG TABS Take 1 tablet by mouth 3 (three) times daily as needed. 90 tablet 1  . Levonorgestrel (SKYLA) 13.5 MG IUD by Intrauterine route.     No current facility-administered medications on file prior to visit.    Review of Systems  Constitutional: Negative for other unusual  diaphoresis or sweats HENT: Negative for ear discharge or swelling Eyes: Negative for other worsening visual disturbances Respiratory: Negative for stridor or other swelling  Gastrointestinal: Negative for worsening distension or other blood Genitourinary: Negative for retention or other urinary change Musculoskeletal: Negative for other MSK pain or swelling Skin: Negative for color change or other new lesions Neurological: Negative for worsening tremors and other numbness  Psychiatric/Behavioral: Negative for worsening agitation or other fatigue All other system neg per pt    Objective:   Physical Exam BP 120/80   Pulse 89   Temp 98.2 F (36.8 C) (Oral)   Ht 5\' 7"  (1.702 m)   Wt 209 lb (94.8 kg)   LMP 01/28/2017 (Approximate)   SpO2 100%   BMI 32.73 kg/m  VS noted,  Constitutional: Pt appears in NAD HENT: Head: NCAT.  Right Ear: External ear normal.  Left Ear: External ear normal.  Eyes: . Pupils are equal, round, and reactive to light. Conjunctivae and EOM are normal Nose: without d/c or deformity Neck: Neck supple. Gross normal ROM Cardiovascular: Normal rate and regular rhythm.; pt has tender area left ant chest all without swelling, rash or or overlying skin change reproducing pain + tender bilat costal margins as well   Pulmonary/Chest: Effort normal and breath sounds without rales or wheezing.  Abd:  Soft, NT, ND, + BS, no organomegaly spine nontender Neurological: Pt is alert. At baseline orientation, motor grossly intact Skin:  Skin is warm. No rashes, other new lesions, no LE edema Psychiatric: Pt behavior is normal without agitation , 1-2+ nervous No other exam findings  ECG today I have personally interpreted NSR with non specific ST T wave changes     Assessment & Plan:

## 2017-02-03 NOTE — Assessment & Plan Note (Signed)
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 

## 2017-02-03 NOTE — Assessment & Plan Note (Addendum)
Most likely c/w msk strain, for d dimer given FH but low suspicion overall for PE, Cardiac or PNA; for cxr as well  Note:  Total time for pt hx, exam, review of record with pt in the room, determination of diagnoses and plan for further eval and tx is > 40 min, with over 50% spent in coordination and counseling of patient including the differential dx, tx, further evaluation and other management of CP, anxiety, costal margin pain, back pain and dizziness

## 2017-02-03 NOTE — Assessment & Plan Note (Signed)
Mild to mod, no panic, not depressed , for xanax prn intermittent use only

## 2017-02-04 LAB — TSH: TSH: 1.59 u[IU]/mL (ref 0.35–4.50)

## 2017-04-10 ENCOUNTER — Ambulatory Visit: Payer: BLUE CROSS/BLUE SHIELD | Admitting: Nurse Practitioner

## 2017-05-19 DIAGNOSIS — M546 Pain in thoracic spine: Secondary | ICD-10-CM | POA: Diagnosis not present

## 2017-05-19 DIAGNOSIS — M9901 Segmental and somatic dysfunction of cervical region: Secondary | ICD-10-CM | POA: Diagnosis not present

## 2017-05-19 DIAGNOSIS — M9902 Segmental and somatic dysfunction of thoracic region: Secondary | ICD-10-CM | POA: Diagnosis not present

## 2017-05-19 DIAGNOSIS — M4003 Postural kyphosis, cervicothoracic region: Secondary | ICD-10-CM | POA: Diagnosis not present

## 2017-05-21 DIAGNOSIS — M4003 Postural kyphosis, cervicothoracic region: Secondary | ICD-10-CM | POA: Diagnosis not present

## 2017-05-21 DIAGNOSIS — M546 Pain in thoracic spine: Secondary | ICD-10-CM | POA: Diagnosis not present

## 2017-05-21 DIAGNOSIS — M9901 Segmental and somatic dysfunction of cervical region: Secondary | ICD-10-CM | POA: Diagnosis not present

## 2017-05-21 DIAGNOSIS — M9902 Segmental and somatic dysfunction of thoracic region: Secondary | ICD-10-CM | POA: Diagnosis not present

## 2017-07-16 DIAGNOSIS — M546 Pain in thoracic spine: Secondary | ICD-10-CM | POA: Diagnosis not present

## 2017-07-16 DIAGNOSIS — M9902 Segmental and somatic dysfunction of thoracic region: Secondary | ICD-10-CM | POA: Diagnosis not present

## 2017-07-16 DIAGNOSIS — M4003 Postural kyphosis, cervicothoracic region: Secondary | ICD-10-CM | POA: Diagnosis not present

## 2017-07-16 DIAGNOSIS — M9901 Segmental and somatic dysfunction of cervical region: Secondary | ICD-10-CM | POA: Diagnosis not present

## 2017-11-02 ENCOUNTER — Encounter: Payer: Self-pay | Admitting: Family

## 2017-11-02 ENCOUNTER — Ambulatory Visit: Payer: BLUE CROSS/BLUE SHIELD | Admitting: Family

## 2017-11-02 VITALS — BP 116/80 | HR 92 | Temp 97.8°F | Ht 67.0 in | Wt 214.0 lb

## 2017-11-02 DIAGNOSIS — T25012A Burn of unspecified degree of left ankle, initial encounter: Secondary | ICD-10-CM | POA: Diagnosis not present

## 2017-11-02 DIAGNOSIS — Z23 Encounter for immunization: Secondary | ICD-10-CM

## 2017-11-02 DIAGNOSIS — T300 Burn of unspecified body region, unspecified degree: Secondary | ICD-10-CM

## 2017-11-02 MED ORDER — SILVER SULFADIAZINE 1 % EX CREA: 1.0000 "application " | TOPICAL_CREAM | Freq: Two times a day (BID) | CUTANEOUS | 0 refills | Status: DC

## 2017-11-02 NOTE — Progress Notes (Signed)
Natalie Montgomery is a 32 y.o. female with the following history as recorded in [facility]:  Patient Active Problem List   Diagnosis Date Noted  . Chest pain 02/03/2017  . Costal margin pain 02/03/2017  . Back pain 02/03/2017  . Anxiety 02/03/2017  . Dizziness 02/03/2017  . 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 ALPRAZolam (XANAX) 0.25 MG tablet Take 1 tablet (0.25 mg total) by mouth 2 (two) times daily as needed for anxiety. 40 tablet 0  . cyclobenzaprine (FLEXERIL) 5 MG tablet Take 1 tablet (5 mg total) by mouth 3 (three) times daily as needed for muscle spasms. 30 tablet 1  . Levonorgestrel (SKYLA) 13.5 MG IUD Skyla 14 mcg/24 hrs (3 yrs) 13.5 mg intrauterine device  Take 1 device by intrauterine route.    . meloxicam (MOBIC) 15 MG tablet Take 1 tablet (15 mg total) by mouth daily as needed for pain. 30 tablet 3  . silver sulfADIAZINE (SILVADENE) 1 % cream Apply 1 application topically 2 (two) times daily. 25 g 0   No current facility-administered medications for this visit.     Allergies: Pecan nut (diagnostic)  Past Medical History:  Diagnosis Date  . Back pain   . History of chicken pox   . History of kidney stones     History reviewed. No pertinent surgical history.  Family History  Problem Relation Age of Onset  . Hypertension Mother   . Diabetes Mother   . Hypertension Father   . Pulmonary embolism Sister   . Multiple sclerosis Maternal Grandmother   . Heart disease Maternal Grandfather     Social History   Tobacco Use  . Smoking status: Never Smoker  . Smokeless tobacco: Never Used  Substance Use Topics  . Alcohol use: Yes    Alcohol/week: 4.0 standard drinks    Types: 1 Standard drinks or equivalent, 1 Glasses of wine, 1 Cans of beer, 1 Shots of liquor per week    Subjective:  Patient scheduled her appointment with concerns about possible toenail infection- had cut her toes last week and  was concerned that the toe was "red and hot." Notes that symptoms actually improving in the past 24 hours; Also mentions that dropped an iron on the inside of her left ankle on Friday evening- has burn on inner ankle/ blister noted; has been treating with Neosporin;  Objective:  Vitals:   11/02/17 0839  BP: 116/80  Pulse: 92  Temp: 97.8 F (36.6 C)  TempSrc: Oral  SpO2: 96%  Weight: 214 lb (97.1 kg)  Height: 5\' 7"  (1.702 m)    General: Well developed, well nourished, in no acute distress  Skin : Warm and dry. Burn on inner left ankle approximately 4 cm across with central blistering Head: Normocephalic and atraumatic  Lungs: Respirations unlabored;  Neurologic: Alert and oriented; speech intact; face symmetrical; moves all extremities well; CNII-XII intact without focal deficit   Assessment:  1. Burn     Plan:  Tdap up to date; Rx for Silvadene cream- apply bid to affected area; follow-up worse, no better.  Flu vaccine given today;  Follow-up with GYN for yearly exam;   No follow-ups on file.  No orders of the defined types were placed in this encounter.   Requested Prescriptions   Signed Prescriptions Disp Refills  . silver sulfADIAZINE (SILVADENE) 1 % cream 25 g 0    Sig: Apply 1 application topically 2 (two) times daily.

## 2017-11-02 NOTE — Addendum Note (Signed)
Addended by: Karma GanjaSMITH, CARLA J on: 11/02/2017 09:12 AM   Modules accepted: Orders

## 2017-12-17 DIAGNOSIS — Z30433 Encounter for removal and reinsertion of intrauterine contraceptive device: Secondary | ICD-10-CM | POA: Diagnosis not present

## 2017-12-17 DIAGNOSIS — Z6834 Body mass index (BMI) 34.0-34.9, adult: Secondary | ICD-10-CM | POA: Diagnosis not present

## 2017-12-17 DIAGNOSIS — Z3202 Encounter for pregnancy test, result negative: Secondary | ICD-10-CM | POA: Diagnosis not present

## 2017-12-25 DIAGNOSIS — Z01419 Encounter for gynecological examination (general) (routine) without abnormal findings: Secondary | ICD-10-CM | POA: Diagnosis not present

## 2017-12-25 DIAGNOSIS — Z6833 Body mass index (BMI) 33.0-33.9, adult: Secondary | ICD-10-CM | POA: Diagnosis not present

## 2017-12-25 LAB — HM PAP SMEAR

## 2017-12-28 ENCOUNTER — Encounter: Payer: Self-pay | Admitting: Family

## 2017-12-28 NOTE — Progress Notes (Signed)
Outside notes received. Information abstracted. Notes sent to scan.  

## 2018-03-26 ENCOUNTER — Encounter: Payer: Self-pay | Admitting: Family

## 2018-03-26 ENCOUNTER — Ambulatory Visit: Payer: BLUE CROSS/BLUE SHIELD | Admitting: Family

## 2018-03-26 VITALS — BP 112/80 | HR 96 | Temp 98.0°F | Ht 67.0 in | Wt 222.1 lb

## 2018-03-26 DIAGNOSIS — J019 Acute sinusitis, unspecified: Secondary | ICD-10-CM | POA: Diagnosis not present

## 2018-03-26 MED ORDER — FLUTICASONE PROPIONATE 50 MCG/ACT NA SUSP: 2.0000 | Freq: Every day | NASAL | 6 refills | Status: DC

## 2018-03-26 MED ORDER — CEFDINIR 300 MG PO CAPS: 300.0000 mg | ORAL_CAPSULE | Freq: Two times a day (BID) | ORAL | 0 refills | Status: DC

## 2018-03-26 MED ORDER — BENZONATATE 100 MG PO CAPS: 100.0000 mg | ORAL_CAPSULE | Freq: Three times a day (TID) | ORAL | 0 refills | Status: DC | PRN

## 2018-03-26 NOTE — Progress Notes (Signed)
Natalie Montgomery is a 33 y.o. female with the following history as recorded in [facility]:  Patient Active Problem List   Diagnosis Date Noted  . Chest pain 02/03/2017  . Costal margin pain 02/03/2017  . Back pain 02/03/2017  . Anxiety 02/03/2017  . Dizziness 02/03/2017  . 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 ALPRAZolam (XANAX) 0.25 MG tablet Take 1 tablet (0.25 mg total) by mouth 2 (two) times daily as needed for anxiety. 40 tablet 0  . cyclobenzaprine (FLEXERIL) 5 MG tablet Take 1 tablet (5 mg total) by mouth 3 (three) times daily as needed for muscle spasms. 30 tablet 1  . Levonorgestrel (KYLEENA) 19.5 MG IUD Kyleena 17.5 mcg/24 hrs (46yrs) 19.5mg  intrauterine device  Take 1 device by intrauterine route.    . benzonatate (TESSALON) 100 MG capsule Take 1 capsule (100 mg total) by mouth 3 (three) times daily as needed. 20 capsule 0  . cefdinir (OMNICEF) 300 MG capsule Take 1 capsule (300 mg total) by mouth 2 (two) times daily. 20 capsule 0  . fluticasone (FLONASE) 50 MCG/ACT nasal spray Place 2 sprays into both nostrils daily. 16 g 6   No current facility-administered medications for this visit.     Allergies: Pecan nut (diagnostic)  Past Medical History:  Diagnosis Date  . Back pain   . History of chicken pox   . History of kidney stones     History reviewed. No pertinent surgical history.  Family History  Problem Relation Age of Onset  . Hypertension Mother   . Diabetes Mother   . Hypertension Father   . Pulmonary embolism Sister   . Multiple sclerosis Maternal Grandmother   . Heart disease Maternal Grandfather     Social History   Tobacco Use  . Smoking status: Never Smoker  . Smokeless tobacco: Never Used  Substance Use Topics  . Alcohol use: Yes    Alcohol/week: 4.0 standard drinks    Types: 1 Standard drinks or equivalent, 1 Glasses of wine, 1 Cans of beer, 1 Shots of liquor per week     Subjective:  Patient presents with concerns for 2 week history of cough, congestion, nasal pressure; started with sinus pain, pressure; seemed to improve initially but symptoms have since re-flared; + easily winded with this infection;  LMP- IUD     Objective:  Vitals:   03/26/18 1542  BP: 112/80  Pulse: 96  Temp: 98 F (36.7 C)  TempSrc: Oral  SpO2: 99%  Weight: 222 lb 1.3 oz (100.7 kg)  Height: 5\' 7"  (1.702 m)    General: Well developed, well nourished, in no acute distress  Skin : Warm and dry.  Head: Normocephalic and atraumatic  Eyes: Sclera and conjunctiva clear; pupils round and reactive to light; extraocular movements intact  Ears: External normal; canals clear; tympanic membranes congested bilaterally Oropharynx: Pink, supple. No suspicious lesions  Neck: Supple without thyromegaly, adenopathy  Lungs: Respirations unlabored; clear to auscultation bilaterally without wheeze, rales, rhonchi  CVS exam: normal rate and regular rhythm.  Neurologic: Alert and oriented; speech intact; face symmetrical; moves all extremities well; CNII-XII intact without focal deficit   Assessment:  1. Acute sinusitis, recurrence not specified, unspecified location     Plan:  Rx for Omnicef 300 mg bid x 10 days, Flonase NS and Tessalon perles; increase fluids, rest and follow-up worse, no better.   No follow-ups on file.  No orders of the defined types  were placed in this encounter.   Requested Prescriptions   Signed Prescriptions Disp Refills  . fluticasone (FLONASE) 50 MCG/ACT nasal spray 16 g 6    Sig: Place 2 sprays into both nostrils daily.  . benzonatate (TESSALON) 100 MG capsule 20 capsule 0    Sig: Take 1 capsule (100 mg total) by mouth 3 (three) times daily as needed.  . cefdinir (OMNICEF) 300 MG capsule 20 capsule 0    Sig: Take 1 capsule (300 mg total) by mouth 2 (two) times daily.

## 2018-08-17 ENCOUNTER — Ambulatory Visit: Payer: Self-pay

## 2018-08-17 NOTE — Telephone Encounter (Signed)
Appointment made for 7/1

## 2018-08-17 NOTE — Telephone Encounter (Signed)
Out going call to Patient with a complaint of high pulse rate .  Patient states that Her heart rate is 102 just sitting and sewing.  Onset was last Thursday.  States that it comes and goes Heart rate is 102.  Feels pressure in her lungs when  Heart rate goes up.  Patient does not what is causing the rapid heart rate. Reviewed protocol and Provided care advise .  Transferred call to [facility] to schedule appt.    Reason for Disposition . [1] Palpitations AND [2] no improvement after using CARE ADVICE  Answer Assessment - Initial Assessment Questions 1. DESCRIPTION: "Please describe your heart rate or heart beat that you are having" (e.g., fast/slow, regular/irregular, skipped or extra beats, "palpitations")     Rushed and feel like its  Up in my throat 2. ONSET: "When did it start?" (Minutes, hours or days)      Last Thursday 3. DURATION: "How long does it last" (e.g., seconds, minutes, hours)     Stops and starts  4. PATTERN "Does it come and go, or has it been constant since it started?"  "Does it get worse with exertion?"   "Are you feeling it now?"     Comes and go 5. TAP: "Using your hand, can you tap out what you are feeling on a chair or table in front of you, so that I can hear?" (Note: not all patients can do this)        6. HEART RATE: "Can you tell me your heart rate?" "How many beats in 15 seconds?"  (Note: not all patients can do this)       102 7. RECURRENT SYMPTOM: "Have you ever had this before?" If so, ask: "When was the last time?" and "What happened that time?"       8. CAUSE: "What do you think is causing the palpitations?"     "  I dont know" 9. CARDIAC HISTORY: "Do you have any history of heart disease?" (e.g., heart attack, angina, bypass surgery, angioplasty, arrhythmia)       10. OTHER SYMPTOMS: "Do you have any other symptoms?" (e.g., dizziness, chest pain, sweating, difficulty breathing)       Lungs  Feel pressure, this  Week felt dizzy 11. PREGNANCY: "Is there any  chance you are pregnant?" "When was your last menstrual period?"       Iud  Protocols used: [place]

## 2018-08-18 ENCOUNTER — Telehealth: Payer: Self-pay

## 2018-08-18 ENCOUNTER — Other Ambulatory Visit: Payer: Self-pay

## 2018-08-18 ENCOUNTER — Encounter: Payer: Self-pay | Admitting: Family

## 2018-08-18 ENCOUNTER — Other Ambulatory Visit: Payer: BC Managed Care – PPO

## 2018-08-18 ENCOUNTER — Ambulatory Visit (INDEPENDENT_AMBULATORY_CARE_PROVIDER_SITE_OTHER): Payer: BC Managed Care – PPO | Admitting: Family

## 2018-08-18 VITALS — BP 116/74 | HR 93 | Temp 98.3°F | Ht 67.0 in | Wt 223.1 lb

## 2018-08-18 DIAGNOSIS — Z20822 Contact with and (suspected) exposure to covid-19: Secondary | ICD-10-CM

## 2018-08-18 DIAGNOSIS — R002 Palpitations: Secondary | ICD-10-CM | POA: Diagnosis not present

## 2018-08-18 DIAGNOSIS — R6889 Other general symptoms and signs: Secondary | ICD-10-CM | POA: Diagnosis not present

## 2018-08-18 NOTE — Telephone Encounter (Signed)
Scheduled patient for COVID 19 testing today at 3:15 pm.  Testing protocol reviewed.

## 2018-08-18 NOTE — Progress Notes (Signed)
Natalie Montgomery is a 33 y.o. female with the following history as recorded in [facility]:  Patient Active Problem List   Diagnosis Date Noted  . Chest pain 02/03/2017  . Costal margin pain 02/03/2017  . Back pain 02/03/2017  . Anxiety 02/03/2017  . Dizziness 02/03/2017  . Sacroiliac inflammation ([place]) 11/05/2015    Current Outpatient Medications  Medication Sig Dispense Refill  . ALPRAZolam (XANAX) 0.25 MG tablet Take 1 tablet (0.25 mg total) by mouth 2 (two) times daily as needed for anxiety. 40 tablet 0  . cyclobenzaprine (FLEXERIL) 5 MG tablet Take 1 tablet (5 mg total) by mouth 3 (three) times daily as needed for muscle spasms. 30 tablet 1  . fluticasone (FLONASE) 50 MCG/ACT nasal spray Place 2 sprays into both nostrils daily. 16 g 6  . Levonorgestrel (KYLEENA) 19.5 MG IUD Kyleena 17.5 mcg/24 hrs (30yrs) 19.5mg  intrauterine device  Take 1 device by intrauterine route.     No current facility-administered medications for this visit.     Allergies: Pecan nut (diagnostic)  Past Medical History:  Diagnosis Date  . Back pain   . History of chicken pox   . History of kidney stones     History reviewed. No pertinent surgical history.  Family History  Problem Relation Age of Onset  . Hypertension Mother   . Diabetes Mother   . Hypertension Father   . Pulmonary embolism Sister   . Multiple sclerosis Maternal Grandmother   . Heart disease Maternal Grandfather     Social History   Tobacco Use  . Smoking status: Never Smoker  . Smokeless tobacco: Never Used  Substance Use Topics  . Alcohol use: Yes    Alcohol/week: 4.0 standard drinks    Types: 1 Standard drinks or equivalent, 1 Glasses of wine, 1 Cans of beer, 1 Shots of liquor per week    Subjective:  Patient presents with concerns for elevated heart rate last week- felt like anxiety component was involved; took a Xanax which helped with the symptoms almost immediately; no chest pain or shortness on exertion; Recently found  out that a friend has tested positive for COVID- was at their house 2 weeks ago; Admits that anxiety has been up since finding out about the friend's diagnosis.     Objective:  Vitals:   08/18/18 0928  BP: 116/74  Pulse: 93  Temp: 98.3 F (36.8 C)  TempSrc: Oral  SpO2: 96%  Weight: 223 lb 1.3 oz (101.2 kg)  Height: 5\' 7"  (1.702 m)    General: Well developed, well nourished, in no acute distress  Skin : Warm and dry.  Head: Normocephalic and atraumatic  Eyes: Sclera and conjunctiva clear; pupils round and reactive to light; extraocular movements intact  Ears: External normal; canals clear; tympanic membranes normal  Oropharynx: Pink, supple. No suspicious lesions  Neck: Supple without thyromegaly, adenopathy  Lungs: Respirations unlabored; clear to auscultation bilaterally without wheeze, rales, rhonchi  CVS exam: normal rate and regular rhythm.  Neurologic: Alert and oriented; speech intact; face symmetrical; moves all extremities well; CNII-XII intact without focal deficit   Assessment:  1. Palpitations     Plan:  EKG in office shows NSR; unfortunately due to recent COVID exposure, I cannot have patient get a CXR or labs today; will go ahead with COVID testing but feel that anxiety is most likely cause of symptoms; she will continue Xanax as needed; follow-up to be determined.   No follow-ups on file.  Orders Placed This Encounter  Procedures  . EKG 12-Lead    Requested Prescriptions    No prescriptions requested or ordered in this encounter

## 2018-08-25 LAB — NOVEL CORONAVIRUS, NAA: SARS-CoV-2, NAA: NOT DETECTED

## 2018-09-29 ENCOUNTER — Encounter: Payer: Self-pay | Admitting: Family

## 2018-09-29 ENCOUNTER — Other Ambulatory Visit: Payer: Self-pay | Admitting: Internal Medicine

## 2018-10-01 ENCOUNTER — Other Ambulatory Visit: Payer: Self-pay | Admitting: Family

## 2018-10-01 DIAGNOSIS — T7840XS Allergy, unspecified, sequela: Secondary | ICD-10-CM

## 2018-10-01 MED ORDER — ALPRAZOLAM 0.25 MG PO TABS: 0.2500 mg | ORAL_TABLET | Freq: Two times a day (BID) | ORAL | 0 refills | Status: DC | PRN

## 2018-10-06 ENCOUNTER — Other Ambulatory Visit: Payer: Self-pay

## 2018-10-06 ENCOUNTER — Ambulatory Visit: Payer: BC Managed Care – PPO | Admitting: Allergy

## 2018-10-06 ENCOUNTER — Encounter: Payer: Self-pay | Admitting: Allergy

## 2018-10-06 VITALS — BP 118/80 | HR 93 | Temp 98.2°F | Resp 16 | Ht 68.0 in | Wt 220.4 lb

## 2018-10-06 DIAGNOSIS — T781XXD Other adverse food reactions, not elsewhere classified, subsequent encounter: Secondary | ICD-10-CM | POA: Diagnosis not present

## 2018-10-06 DIAGNOSIS — J3089 Other allergic rhinitis: Secondary | ICD-10-CM | POA: Insufficient documentation

## 2018-10-06 DIAGNOSIS — T781XXA Other adverse food reactions, not elsewhere classified, initial encounter: Secondary | ICD-10-CM | POA: Insufficient documentation

## 2018-10-06 MED ORDER — EPINEPHRINE 0.3 MG/0.3ML IJ SOAJ: 0.3000 mg | Freq: Once | INTRAMUSCULAR | 2 refills | Status: AC

## 2018-10-06 NOTE — Assessment & Plan Note (Signed)
Reactions to possibly seafood/shellfish in the form of oral discomfort and angioedema. Took benadryl with good benefit. Reactions to pecans in the past with tongue angioedema.  No previous allergy testing or epinephrine use.  Today's skin testing showed: Negative to tree nuts, seafood, shellfish and dairy.   Food allergen skin testing has excellent negative predictive value however there is still a 5% chance that the allergy exists. Therefore, we will investigate further with serum specific IgE levels and, if negative then schedule for open graded oral food challenge.  Until the food allergy has been definitively ruled out, the patient is to continue meticulous avoidance of seafood, shellfish, treenuts and have access to epinephrine autoinjector 2 pack.  I have prescribed epinephrine injectable and demonstrated proper use. For mild symptoms you can take over the counter antihistamines such as Benadryl and monitor symptoms closely. If symptoms worsen or if you have severe symptoms including breathing issues, throat closure, significant swelling, whole body hives, severe diarrhea and vomiting, lightheadedness then inject epinephrine and seek immediate medical care afterwards.  Food action plan given.   Keep track of any additional episodes and write down what was consumed.   You may be lactose intolerance. Try lactaid milk products.

## 2018-10-06 NOTE — Progress Notes (Signed)
New Patient Note  RE: Natalie Montgomery MRN: [id number] DOB: 03-Jul-1985 Date of Office Visit: 10/06/2018  Referring provider: Olive BassMurray, Laura Woodruff,* Primary care provider: Olive BassMurray, Laura Woodruff, FNP  Chief Complaint: Food Intolerance (pecans, tongue starts to hurt and swelling, possible seafood, tolerates peanuts) and Urticaria (not sure what happened just broke out and had some lip swelling)  History of Present Illness: I had the pleasure of seeing Natalie Slickernna Dornfeld for initial evaluation at the [facility] on 10/06/2018. She is a 33 y.o. female, who is referred here by Olive BassMurray, Laura Woodruff, FNP for the evaluation of food allergy.   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   Seafood:  The first reaction occurred about 8 months ago after she ate small amount of crab dip which was cross contaminated with other foods. She usually tolerates crab with no issues but her husband was dipping in other seafood/shellfish. Symptoms started within minutes and was in the form of tongue irritation/angioedema, throat discomfort, SOB. Denies any associated cofactors such as exertion, infection, NSAID use, or alcohol consumption. The symptoms lasted for a few hours after Benadryl.  About 2 weeks ago she had some flatbread but it was made in a seafood restaurant. She broke out in hives on her chest and little facial swelling. Symptoms resolved within 1.5 hour after benadryl. Concerned if it was cross contaminated with seafood/shellfish.   Pecans: The first reaction occurred about 6 years ago after she ate a grape pecan salad. Symptoms started within minutes and was in the form of tongue swelling. However she had worsening symptoms with each subsequent exposure. Last exposure was about 1 year ago. Symptoms resolved eventually after benadryl.   She does not have access to epinephrine autoinjector and not needed to use it.   Past work up includes: none. Dietary History:  patient has been eating other foods including milk, eggs, peanut, sesame, crabs, soy, wheat, meats, fruits and vegetables.  She reports reading labels and avoiding tree nuts, and limited seafood/shellfish in diet completely.   Assessment and Plan: Natalie Montgomery is a 33 y.o. female with: Adverse food reaction Reactions to possibly seafood/shellfish in the form of oral discomfort and angioedema. Took benadryl with good benefit. Reactions to pecans in the past with tongue angioedema.  No previous allergy testing or epinephrine use.  Today's skin testing showed: Negative to tree nuts, seafood, shellfish and dairy.   Food allergen skin testing has excellent negative predictive value however there is still a 5% chance that the allergy exists. Therefore, we will investigate further with serum specific IgE levels and, if negative then schedule for open graded oral food challenge.  Until the food allergy has been definitively ruled out, the patient is to continue meticulous avoidance of seafood, shellfish, treenuts and have access to epinephrine autoinjector 2 pack.  I have prescribed epinephrine injectable and demonstrated proper use. For mild symptoms you can take over the counter antihistamines such as Benadryl and monitor symptoms closely. If symptoms worsen or if you have severe symptoms including breathing issues, throat closure, significant swelling, whole body hives, severe diarrhea and vomiting, lightheadedness then inject epinephrine and seek immediate medical care afterwards.  Food action plan given.   Keep track of any additional episodes and write down what was consumed.   You may be lactose intolerance. Try lactaid milk products.  Other allergic rhinitis Rhinitis symptoms during the spring. Takes Flonase and Claritin prn.  May use over the counter antihistamines such as  Zyrtec (cetirizine), Claritin (loratadine), Allegra (fexofenadine), or Xyzal (levocetirizine) daily as needed.  May use  Flonase 1 spray Bid prn.  If symptoms persistent/worsen then recommend environmental allergy testing in the future.   Return in about 6 months (around 04/08/2019).  Meds ordered this encounter  Medications  . EPINEPHrine (AUVI-Q) 0.3 mg/0.3 mL IJ SOAJ injection    Sig: Inject 0.3 mLs (0.3 mg total) into the muscle once for 1 dose. As directed for life-threatening allergic reactions    Dispense:  2 each    Refill:  2  . EPINEPHrine (AUVI-Q) 0.3 mg/0.3 mL IJ SOAJ injection    Sig: Inject 0.3 mLs (0.3 mg total) into the muscle once for 1 dose. As directed for life-threatening allergic reactions    Dispense:  2 each    Refill:  2    Lab Orders     Allergens(7)     Allergen Profile, Shellfish     Allergy Panel 19, [employer]  Other allergy screening: Asthma: no Rhino conjunctivitis: yes  Mild rhinitis symptoms during the spring season. Takes Flonase prn and Claritin prn with good benefit.  Medication allergy: no Hymenoptera allergy: no Eczema:no History of recurrent infections suggestive of immunodeficency: no  Diagnostics: Skin Testing: Select foods. Negative test to: seafood, shellfish and tree nuts and dairy. Results discussed with patient/family. Food Adult Perc - 10/06/18 0900    Time Antigen Placed  [id number]    Allergen Manufacturer  Waynette ButteryGreer    Location  Arm    Number of allergen test  25     Control-buffer 50% Glycerol  Negative    Control-Histamine 1 mg/ml  2+    5. Milk, cow  Negative    7. Casein  Negative    8. Shellfish Mix  Negative    9. Fish Mix  Negative    10. Cashew  Negative    11. Pecan Food  Negative    12. Walnut Food  Negative    13. Almond  Negative    14. Hazelnut  Negative    15. [place] nut  Negative    16. Coconut  Negative    17. Pistachio  Negative    18. Catfish  Negative    19. Bass  Negative    20. Trout  Negative    21. Tuna  Negative    22. Salmon  Negative    23. Flounder  Negative    24. Codfish  Negative    25. Shrimp  Negative     26. Crab  Negative    27. Lobster  Negative    28. Oyster  Negative    29. Scallops  Negative       Past Medical History: Patient Active Problem List   Diagnosis Date Noted  . Adverse food reaction 10/06/2018  . Other allergic rhinitis 10/06/2018  . Chest pain 02/03/2017  . Costal margin pain 02/03/2017  . Back pain 02/03/2017  . Anxiety 02/03/2017  . Dizziness 02/03/2017  . 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 Back pain   . History of chicken pox   . History of kidney stones    Past Surgical History: History reviewed. No pertinent surgical history. Medication List:  Current Outpatient Medications  Medication Sig Dispense Refill  . ALPRAZolam (XANAX) 0.25 MG tablet Take 1 tablet (0.25 mg total) by mouth 2 (two) times daily as needed for anxiety. 40 tablet 0  . Levonorgestrel (KYLEENA) 19.5 MG IUD [place]ena  17.5 mcg/24 hrs (40yrs) 19.5mg  intrauterine device  Take 1 device by intrauterine route.    Marland Kitchen EPINEPHrine (AUVI-Q) 0.3 mg/0.3 mL IJ SOAJ injection Inject 0.3 mLs (0.3 mg total) into the muscle once for 1 dose. As directed for life-threatening allergic reactions 2 each 2  . EPINEPHrine (AUVI-Q) 0.3 mg/0.3 mL IJ SOAJ injection Inject 0.3 mLs (0.3 mg total) into the muscle once for 1 dose. As directed for life-threatening allergic reactions 2 each 2  . fluticasone (FLONASE) 50 MCG/ACT nasal spray Place 2 sprays into both nostrils daily. (Patient not taking: Reported on 10/06/2018) 16 g 6   No current facility-administered medications for this visit.    Allergies: Allergies  Allergen Reactions  . Pecan Nut (Diagnostic)    Social History: Social History   Socioeconomic History  . Marital status: Married    Spouse name: Not on file  . Number of children: 0  . Years of education: 21  . Highest education level: Not on file  Occupational History  . Occupation: Scientist, research (physical sciences)  . Financial resource strain: Not on  file  . Food insecurity    Worry: Not on file    Inability: Not on file  . Transportation needs    Medical: Not on file    Non-medical: Not on file  Tobacco Use  . Smoking status: Never Smoker  . Smokeless tobacco: Never Used  Substance and Sexual Activity  . Alcohol use: Yes    Alcohol/week: 4.0 standard drinks    Types: 1 Glasses of wine, 1 Cans of beer, 1 Shots of liquor, 1 Standard drinks or equivalent per week  . Drug use: No  . Sexual activity: Yes    Birth control/protection: I.U.D.  Lifestyle  . Physical activity    Days per week: Not on file    Minutes per session: Not on file  . Stress: Not on file  Relationships  . Social Herbalist on phone: Not on file    Gets together: Not on file    Attends religious service: Not on file    Active member of club or organization: Not on file    Attends meetings of clubs or organizations: Not on file    Relationship status: Not on file  Other Topics Concern  . Not on file  Social History Narrative   Fun: Ellyn [employer]   Denies abuse and feels safe at home.    Lives in a house built in [place]. 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 Hypertension Mother   . Diabetes Mother   . Hypertension Father   . Pulmonary embolism Sister   . Multiple sclerosis Maternal Grandmother   . Heart disease Maternal Grandfather    Problem                               Relation Asthma                                   Mother  Eczema  No  Food allergy                          No  Allergic rhino conjunctivitis     No   Review of Systems  Constitutional: Negative for appetite change, chills, fever and unexpected weight change.  HENT: Positive for congestion. Negative for  rhinorrhea.   Eyes: Negative for itching.  Respiratory: Negative for cough, chest tightness, shortness of breath and wheezing.   Cardiovascular: Negative for chest pain.  Gastrointestinal: Negative for abdominal pain.  Genitourinary: Negative for difficulty urinating.  Skin: Negative for rash.       Large tattoo on the back and left forearm  Allergic/Immunologic: Positive for food allergies.  Neurological: Negative for headaches.   Objective: BP 118/80 (BP Location: Left Arm, Patient Position: Sitting, Cuff Size: Large)   Pulse 93   Temp 98.2 F (36.8 C) (Temporal)   Resp 16   Ht 5\' 8"  (1.727 m)   Wt 220 lb 6.4 oz (100 kg)   SpO2 98%   BMI 33.51 kg/m  Body mass index is 33.51 kg/m. Physical Exam  Constitutional: She is oriented to person, place, and time. She appears well-developed and well-nourished.  HENT:  Head: Normocephalic and atraumatic.  Right Ear: External ear normal.  Left Ear: External ear normal.  Nose: Nose normal.  Mouth/Throat: Oropharynx is clear and moist.  Eyes: Conjunctivae and EOM are normal.  Neck: Neck supple.  Cardiovascular: Normal rate, regular rhythm and normal heart sounds. Exam reveals no gallop and no friction rub.  No murmur heard. Pulmonary/Chest: Effort normal and breath sounds normal. She has no wheezes. She has no rales.  Abdominal: Soft.  Neurological: She is alert and oriented to person, place, and time.  Skin: Skin is warm. No rash noted.  Psychiatric: She has a normal mood and affect. Her behavior is normal.  Nursing note and vitals reviewed.  The plan was reviewed with the patient/family, and all questions/concerned were addressed.  It was my pleasure to see Natalie Montgomery today and participate in her care. Please feel free to contact me with any questions or concerns.  Sincerely,  Wyline MoodYoon Kim, DO Allergy & Immunology  [facility] [place] office: [phone] [facility] office: [phone] [place]  office: [phone]

## 2018-10-06 NOTE — Patient Instructions (Addendum)
Today's skin testing showed: Negative to tree nuts, seafood, shellfish.    Food allergen skin testing has excellent negative predictive value however there is still a 5% chance that the allergy exists. Therefore, we will investigate further with serum specific IgE levels and, if negative then schedule for open graded oral food challenge.  Until the food allergy has been definitively ruled out, the patient is to continue meticulous avoidance of seafood, shellfish, treenuts and have access to epinephrine autoinjector 2 pack.  I have prescribed epinephrine injectable and demonstrated proper use. For mild symptoms you can take over the counter antihistamines such as Benadryl and monitor symptoms closely. If symptoms worsen or if you have severe symptoms including breathing issues, throat closure, significant swelling, whole body hives, severe diarrhea and vomiting, lightheadedness then inject epinephrine and seek immediate medical care afterwards.  Food action plan given.    Keep track of symptoms and episodes.  You may be lactose intolerance. Try lactaid milk products.  Follow up in 6 months or sooner

## 2018-10-06 NOTE — Assessment & Plan Note (Signed)
Rhinitis symptoms during the spring. Takes Flonase and Claritin prn.  May use over the counter antihistamines such as Zyrtec (cetirizine), Claritin (loratadine), Allegra (fexofenadine), or Xyzal (levocetirizine) daily as needed.  May use Flonase 1 spray Bid prn.  If symptoms persistent/worsen then recommend environmental allergy testing in the future.

## 2018-10-08 LAB — ALLERGY PANEL 19, SEAFOOD GROUP
Allergen Salmon IgE: <0.1 kU/L
Catfish: <0.1 kU/L
Codfish IgE: <0.1 kU/L
F023-IgE Crab: <0.1 kU/L
F080-IgE Lobster: <0.1 kU/L
Shrimp IgE: <0.1 kU/L
Tuna: <0.1 kU/L

## 2018-10-08 LAB — ALLERGEN PROFILE, SHELLFISH
Clam IgE: <0.1 kU/L
F290-IgE Oyster: <0.1 kU/L
Scallop IgE: <0.1 kU/L

## 2018-10-08 LAB — ALLERGENS(7)
Brazil Nut IgE: <0.1 kU/L
F020-IgE Almond: <0.1 kU/L
F202-IgE Cashew Nut: <0.1 kU/L
Hazelnut (Filbert) IgE: <0.1 kU/L
Peanut IgE: <0.1 kU/L
Pecan Nut IgE: <0.1 kU/L
Walnut IgE: <0.1 kU/L

## 2018-10-11 ENCOUNTER — Encounter: Payer: Self-pay | Admitting: Allergy

## 2018-12-01 ENCOUNTER — Encounter: Payer: Self-pay | Admitting: Family

## 2018-12-01 DIAGNOSIS — Z20828 Contact with and (suspected) exposure to other viral communicable diseases: Secondary | ICD-10-CM | POA: Diagnosis not present

## 2018-12-01 DIAGNOSIS — Z6832 Body mass index (BMI) 32.0-32.9, adult: Secondary | ICD-10-CM | POA: Diagnosis not present

## 2018-12-03 ENCOUNTER — Other Ambulatory Visit: Payer: Self-pay | Admitting: Family

## 2018-12-03 MED ORDER — FLUTICASONE PROPIONATE 50 MCG/ACT NA SUSP: 2.0000 | Freq: Every day | NASAL | 6 refills | Status: DC

## 2019-01-15 DIAGNOSIS — Z20828 Contact with and (suspected) exposure to other viral communicable diseases: Secondary | ICD-10-CM | POA: Diagnosis not present

## 2019-02-14 ENCOUNTER — Ambulatory Visit: Payer: Self-pay

## 2019-02-14 NOTE — Telephone Encounter (Signed)
Incoming call from patient with a complaint that she fell las Monday . Injured both knees.  Is able to stand.  Rates pain at a seven.  Notes swelling one is bigger than the other knee. Patient would like a appointment.  Request a return call at to who she may see.   Patient request a return call.           Reason for Disposition . [1] Last tetanus shot > 10 years ago AND [2] CLEAN cut or scrape (e.g., object AND skin were clean)  Answer Assessment - Initial Assessment Questions 1. MECHANISM: "How did the injury happen?" (e.g., twisting injury, direct blow)      Fell las t Monday 2. ONSET: "When did the injury happen?" (Minutes or hours ago)      *No Answer* 3. LOCATION: "Where is the injury located?"      *No Answer*knees  4. APPEARANCE of INJURY: "What does the injury look like?"     Feels pressure in the knee 5. SEVERITY: "Can you put weight on that leg?" "Can you walk?"     Yes just hurts more on the right one.   6. SIZE: For cuts, bruises, or swelling, ask: "How large is it?" (e.g., inches or centimeters;  entire joint)      denies 7. PAIN: "Is there pain?" If so, ask: "How bad is the pain?"    (e.g., Scale 1-10; or mild, moderate, severe)    7  8. TETANUS: For any breaks in the skin, ask: "When was the last tetanus booster?"    na 9. OTHER SYMPTOMS: "Do you have any other symptoms?"  (e.g., "pop" when knee injured, swelling, locking, buckling)     Swelling.  10. PREGNANCY: "Is there any chance you are pregnant?" "When was your last menstrual period?"  Protocols used: KNEE INJURY-A-AH

## 2019-02-14 NOTE — Telephone Encounter (Signed)
Patient scheduled for 02/15/2019 at 9:40am.

## 2019-02-15 ENCOUNTER — Ambulatory Visit (INDEPENDENT_AMBULATORY_CARE_PROVIDER_SITE_OTHER): Payer: BC Managed Care – PPO | Admitting: Internal Medicine

## 2019-02-15 ENCOUNTER — Encounter: Payer: Self-pay | Admitting: Internal Medicine

## 2019-02-15 ENCOUNTER — Other Ambulatory Visit: Payer: Self-pay

## 2019-02-15 VITALS — BP 124/78 | HR 90 | Temp 98.1°F | Ht 68.0 in | Wt 223.0 lb

## 2019-02-15 DIAGNOSIS — M25561 Pain in right knee: Secondary | ICD-10-CM | POA: Diagnosis not present

## 2019-02-15 NOTE — Progress Notes (Signed)
   Subjective:   Patient ID: Natalie Montgomery, female    DOB: 06-17-1985, 33 y.o.   MRN: [id number]  HPI The patient is a 33 YO female coming in for knee pain and swelling since fall on 02/07/19. Fall was at night she was watching the planet alignment and went to stand up and fell. She twisted and hit the right side mainly. Not severe pain afterwards. Next morning she had pain mostly right side and mild left knee. Left knee has improved. Taking ibuprofen if needed for right knee which has not helped much. She is also icing it and did not feel a change. She denies fevers or chills. Denies much swelling in the knees. Denies collapse or instability of the knees since fall. Overall just not improving as expected.   Review of Systems  Constitutional: Negative.   HENT: Negative.   Eyes: Negative.   Respiratory: Negative for cough, chest tightness and shortness of breath.   Cardiovascular: Negative for chest pain, palpitations and leg swelling.  Gastrointestinal: Negative for abdominal distention, abdominal pain, constipation, diarrhea, nausea and vomiting.  Musculoskeletal: Positive for arthralgias.  Skin: Negative.   Neurological: Negative.   Psychiatric/Behavioral: Negative.     Objective:  Physical Exam Constitutional:      Appearance: She is well-developed.  HENT:     Head: Normocephalic and atraumatic.  Cardiovascular:     Rate and Rhythm: Normal rate and regular rhythm.  Pulmonary:     Effort: Pulmonary effort is normal. No respiratory distress.     Breath sounds: Normal breath sounds. No wheezing or rales.  Abdominal:     General: Bowel sounds are normal. There is no distension.     Palpations: Abdomen is soft.     Tenderness: There is no abdominal tenderness. There is no rebound.  Musculoskeletal:        General: Tenderness present.     Cervical back: Normal range of motion.     Comments: Left knee normal, right knee with some tenderness pre-tibial area, ACL and PCL intact right  knee without tenderness over the MCL or LCL  Skin:    General: Skin is warm and dry.  Neurological:     Mental Status: She is alert and oriented to person, place, and time.     Coordination: Coordination normal.     Vitals:   02/15/19 0940  BP: 124/78  Pulse: 90  Temp: 98.1 F (36.7 C)  TempSrc: Oral  SpO2: 97%  Weight: 223 lb (101.2 kg)  Height: 5\' 8"  (1.727 m)    This visit occurred during the SARS-CoV-2 public health emergency.  Safety protocols were in place, including screening questions prior to the visit, additional usage of staff PPE, and extensive cleaning of exam room while observing appropriate contact time as indicated for disinfecting solutions.   Assessment & Plan:

## 2019-02-15 NOTE — Patient Instructions (Signed)
RICE Therapy for Routine Care of Injuries Many injuries can be cared for with rest, ice, compression, and elevation (RICE therapy). This includes:  Resting the injured part.  Putting ice on the injury.  Putting pressure (compression) on the injury.  Raising the injured part (elevation). Using RICE therapy can help to lessen pain and swelling. Supplies needed:  Ice.  Plastic bag.  Towel.  Elastic bandage.  Pillow or pillows to raise (elevate) your injured body part. How to care for your injury with RICE therapy Rest Limit your normal activities, and try not to use the injured part of your body. You can go back to your normal activities when your doctor says it is okay to do them and you feel okay. Ask your doctor if you should do exercises to help your injury get better. Ice Put ice on the injured area. Do not put ice on your bare skin.  Put ice in a plastic bag.  Place a towel between your skin and the bag.  Leave the ice on for 20 minutes, 2-3 times a day. Use ice on as many days as told by your doctor.  Compression Compression means putting pressure on the injured area. This can be done with an elastic bandage. If an elastic bandage has been put on your injury:  Do not wrap the bandage too tight. Wrap the bandage more loosely if part of your body away from the bandage is blue, swollen, cold, painful, or loses feeling (gets numb).  Take off the bandage and put it on again. Do this every 3-4 hours or as told by your doctor.  See your doctor if the bandage seems to make your problems worse.  Elevation Elevation means keeping the injured area raised. If you can, raise the injured area above your heart or the center of your chest. Contact a doctor if:  You keep having pain and swelling.  Your symptoms get worse. Get help right away if:  You have sudden bad pain at your injury or lower than your injury.  You have redness or more swelling around your injury.  You  have tingling or numbness at your injury or lower than your injury, and it does not go away when you take off the bandage. Summary  Many injuries can be cared for using rest, ice, compression, and elevation (RICE therapy).  You can go back to your normal activities when you feel okay and your doctor says it is okay.  Put ice on the injured area as told by your doctor.  Get help if your symptoms get worse or if you keep having pain and swelling. This information is not intended to replace advice given to you by your health care provider. Make sure you discuss any questions you have with your health care provider. Document Released: 07/23/2007 Document Revised: 10/24/2016 Document Reviewed: 10/24/2016 Elsevier Patient Education  2020 [employer]  

## 2019-02-15 NOTE — Assessment & Plan Note (Signed)
Suspect some deep bruising. Likely will improve in another several weeks. Continue RICE and ibuprofen as needed. No sign of ligament injury or knee effusion on exam.

## 2019-02-22 ENCOUNTER — Encounter: Payer: Self-pay | Admitting: Internal Medicine

## 2019-02-23 ENCOUNTER — Other Ambulatory Visit: Payer: Self-pay

## 2019-02-23 ENCOUNTER — Encounter: Payer: Self-pay | Admitting: Family Medicine

## 2019-02-23 ENCOUNTER — Ambulatory Visit (INDEPENDENT_AMBULATORY_CARE_PROVIDER_SITE_OTHER): Payer: BC Managed Care – PPO | Admitting: Family Medicine

## 2019-02-23 ENCOUNTER — Ambulatory Visit: Payer: Self-pay

## 2019-02-23 VITALS — BP 112/72 | HR 94 | Ht 68.0 in | Wt 222.8 lb

## 2019-02-23 DIAGNOSIS — M25561 Pain in right knee: Secondary | ICD-10-CM

## 2019-02-23 NOTE — Patient Instructions (Signed)
Thank you for coming in today. Call or go to the ER if you develop a large red swollen joint with extreme pain or oozing puss.  Use the over the counter voltaren gel for pain up to 4x daily.  Use the compression sleeve during and for 30-60 mins following activity.  Recheck in about 1 month or sooner if needed.  No need for follow up if feeling better.    Meniscus Tear  A meniscus tear is a knee injury that happens when a piece of the meniscus is torn. The meniscus is a thick, rubbery, wedge-shaped cartilage in the knee. Two menisci are located in each knee. They sit between the upper bone (femur) and lower bone (tibia) that make up the knee joint. Each meniscus acts as a shock absorber for the knee. A torn meniscus is one of the most common types of knee injuries. This injury can range from mild to severe. Surgery may be needed to repair a severe tear. What are the causes? This condition may be caused by any kneeling, squatting, twisting, or pivoting movement. Sports-related injuries are the most common cause. These often occur from:  Running and stopping suddenly. ? Changing direction. ? Being tackled or knocked off your feet.  Lifting or carrying heavy weights. As people get older, their menisci get thinner and weaker. In these people, tears can happen more easily, such as from climbing stairs. What increases the risk? You are more likely to develop this condition if you:  Play contact sports.  Have a job that requires kneeling or squatting.  Are female.  Are over 48 years old. What are the signs or symptoms? Symptoms of this condition include:  Knee pain, especially at the side of the knee joint. You may feel pain when the injury occurs, or you may only hear a pop and feel pain later.  A feeling that your knee is clicking, catching, locking, or giving way.  Not being able to fully bend or extend your knee.  Bruising or swelling in your knee. How is this diagnosed? This  condition may be diagnosed based on your symptoms and a physical exam. You may also have tests, such as:  X-rays.  MRI.  A procedure to look inside your knee with a narrow surgical telescope (arthroscopy). You may be referred to a knee specialist (orthopedic surgeon). How is this treated? Treatment for this injury depends on the severity of the tear. Treatment for a mild tear may include:  Rest.  Medicine to reduce pain and swelling. This is usually a nonsteroidal anti-inflammatory drug (NSAID), like ibuprofen.  A knee brace, sleeve, or wrap.  Using crutches or a walker to keep weight off your knee and to help you walk.  Exercises to strengthen your knee (physical therapy). You may need surgery if you have a severe tear or if other treatments are not working. Follow these instructions at home: If you have a brace, sleeve, or wrap:  Wear it as told by your health care provider. Remove it only as told by your health care provider.  Loosen the brace, sleeve, or wrap if your toes tingle, become numb, or turn cold and blue.  Keep the brace, sleeve, or wrap clean and dry.  If the brace, sleeve, or wrap is not waterproof: ? Do not let it get wet. ? Cover it with a watertight covering when you take a bath or shower. Managing pain and swelling   Take over-the-counter and prescription medicines only as told by  your health care provider.  If directed, put ice on your knee: ? If you have a removable brace, sleeve, or wrap, remove it as told by your health care provider. ? Put ice in a plastic bag. ? Place a towel between your skin and the bag. ? Leave the ice on for 20 minutes, 2-3 times per day.  Move your toes often to avoid stiffness and to lessen swelling.  Raise (elevate) the injured area above the level of your heart while you are sitting or lying down. Activity  Do not use the injured limb to support your body weight until your health care provider says that you can. Use  crutches or a walker as told by your health care provider.  Return to your normal activities as told by your health care provider. Ask your health care provider what activities are safe for you.  Perform range-of-motion exercises only as told by your health care provider.  Begin doing exercises to strengthen your knee and leg muscles only as told by your health care provider. After you recover, your health care provider may recommend these exercises to help prevent another injury. General instructions  Use a knee brace, sleeve, or wrap as told by your health care provider.  Ask your health care provider when it is safe to drive if you have a brace, sleeve, or wrap on your knee.  Do not use any products that contain nicotine or tobacco, such as cigarettes, e-cigarettes, and chewing tobacco. If you need help quitting, ask your health care provider.  Ask your health care provider if the medicine prescribed to you: ? Requires you to avoid driving or using heavy machinery. ? Can cause constipation. You may need to take these actions to prevent or treat constipation:  Drink enough fluid to keep your urine pale yellow.  Take over-the-counter or prescription medicines.  Eat foods that are high in fiber, such as beans, whole grains, and fresh fruits and vegetables.  Limit foods that are high in fat and processed sugars, such as fried or sweet foods.  Keep all follow-up visits as told by your health care provider. This is important. Contact a health care provider if:  You have a fever.  Your knee becomes red, tender, or swollen.  Your pain medicine is not helping.  Your symptoms get worse or do not improve after 2 weeks of home care. Summary  A meniscus tear is a knee injury that happens when a piece of the meniscus is torn.  Treatment for this injury depends on the severity of the tear. You may need surgery if you have a severe tear or if other treatments are not working.  Rest,  ice, and raise (elevate) your injured knee as told by your health care provider. This will help lessen pain and swelling.  Contact a health care provider if you have new symptoms, or your symptoms get worse or do not improve after 2 weeks of home care.  Keep all follow-up visits as told by your health care provider. This is important. This information is not intended to replace advice given to you by your health care provider. Make sure you discuss any questions you have with your health care provider. Document Revised: 08/18/2017 Document Reviewed: 08/18/2017 Elsevier Patient Education  2020 [employer].

## 2019-02-23 NOTE — Progress Notes (Signed)
Subjective:    I'm seeing this patient as a consultation for:  Dr. Sharlet Salina. Note will be routed back to referring provider/PCP.  CC: R knee pain  I, Molly Weber, LAT, ATC, am serving as scribe for Dr. Lynne Leader.  HPI: Pt is a 34 y/o female presenting w/ c/o R knee pain since 02/07/19 when she fell after standing up from sitting and twisted her R knee.  She does not think that her knee impacted the ground.    She reports pain and mild swelling and saw her PCP on 02/15/19 who prescribed RICE and OTC anti-inflammatories.  Since seeing her PCP, pt rates her pain as a 6/10 pain today and a 9/10 last night.  She localizes her knee pain to the R anterior knee.  She reports pressure, swelling and warmth in the R knee and radiating proximally and distally.  She reports history of R knee popping which has not changed since her injury.  Aggravating factors include weight-bearing and prolonged standing.  She con't to use RICE principles and purchased a knee compression sleeve.  Past medical history, Surgical history, Family history not pertinant except as noted below, Social history, Allergies, and medications have been entered into the medical record, reviewed, and no changes needed.   Review of Systems: No headache, visual changes, nausea, vomiting, diarrhea, constipation, dizziness, abdominal pain, skin rash, fevers, chills, night sweats, weight loss, swollen lymph nodes, body aches, joint swelling, muscle aches, chest pain, shortness of breath, mood changes, visual or auditory hallucinations.   Objective:    Vitals:   02/23/19 1315  BP: 112/72  Pulse: 94  SpO2: 97%   General: Well Developed, well nourished, and in no acute distress.   MSK: Right knee: No significant effusion.  Normal-appearing Range of motion 0-120 degrees with mild crepitation. Tender palpation lateral joint line otherwise nontender. Stable ligamentous exam to anterior posterior drawer testing valgus varus stress test.  Positive lateral McMurray's test negative medial. Intact flexion and extension strength.  Left knee: Normal-appearing Normal motion. Stable ligamentous exam. Normal strength. Negative McMurray's test.  Mild antalgic gait.  Lab and Radiology Results  Limited musculoskeletal ultrasound right knee Normal-appearing quad tendon with no significant effusion. Normal patellar tendon. Normal-appearing medial meniscus and joint line. Lateral meniscus with 2 small hypoechoic cystic structures superficial to meniscus without significant vascular activity.  No visible tear. Normal bony structures otherwise. Impression: Lateral parameniscal cysts indicating probable lateral meniscus tear.  Procedure: Real-time Ultrasound Guided Injection of right knee Device: Philips Affiniti 50G Images permanently stored and available for review in the ultrasound unit. Verbal informed consent obtained.  Discussed risks and benefits of procedure. Warned about infection bleeding damage to structures skin hypopigmentation and fat atrophy among others. Patient expresses understanding and agreement Time-out conducted.   Noted no overlying erythema, induration, or other signs of local infection.   Skin prepped in a sterile fashion.   Local anesthesia: Topical Ethyl chloride.   With sterile technique and under real time ultrasound guidance:  40 mg of Kenalog and 4 mL of Marcaine injected easily.   Completed without difficulty   Pain immediately resolved suggesting accurate placement of the medication.   Advised to call if fevers/chills, erythema, induration, drainage, or persistent bleeding.   Images permanently stored and available for review in the ultrasound unit.  Impression: Technically successful ultrasound guided injection.       Impression and Recommendations:    Assessment and Plan: 34 y.o. female with right knee pain after injury.  Concerning for lateral meniscus tear based on physical exam and  ultrasound exam today.  Patient does have mild mechanical symptoms but does not have severe locking or catching and has intact strength.  I think it is reasonable to try treating conservatively with compression of the sleeve, Voltaren gel, and trial of steroid injection as above.  Check back in a month.  If not better next step would be x-ray and likely MRI..   Orders Placed This Encounter  Procedures  . [place] - LOWER Extremity - Limited - RIGHT    Order Specific Question:   Reason for Exam (SYMPTOM  OR DIAGNOSIS REQUIRED)    Answer:   R knee pain    Order Specific Question:   Preferred imaging location?    Answer:   [facility]   No orders of the defined types were placed in this encounter.   Discussed warning signs or symptoms. Please see discharge instructions. Patient expresses understanding.   The above documentation has been reviewed and is accurate and complete Clementeen Graham

## 2019-07-26 ENCOUNTER — Encounter: Payer: Self-pay | Admitting: Family

## 2019-07-26 ENCOUNTER — Other Ambulatory Visit: Payer: Self-pay

## 2019-07-26 ENCOUNTER — Ambulatory Visit (INDEPENDENT_AMBULATORY_CARE_PROVIDER_SITE_OTHER): Payer: BC Managed Care – PPO | Admitting: Family

## 2019-07-26 VITALS — BP 110/82 | HR 90 | Temp 98.7°F | Wt 230.2 lb

## 2019-07-26 DIAGNOSIS — H6690 Otitis media, unspecified, unspecified ear: Secondary | ICD-10-CM

## 2019-07-26 MED ORDER — NEOMYCIN-POLYMYXIN-HC 3.5-10000-1 OT SOLN: 3.0000 [drp] | Freq: Three times a day (TID) | OTIC | 0 refills | Status: DC

## 2019-07-26 MED ORDER — CEFDINIR 300 MG PO CAPS: 300.0000 mg | ORAL_CAPSULE | Freq: Two times a day (BID) | ORAL | 0 refills | Status: DC

## 2019-07-26 NOTE — Progress Notes (Signed)
Natalie Montgomery is a 34 y.o. female with the following history as recorded in EpicCare:  Patient Active Problem List   Diagnosis Date Noted  . Right knee pain 02/15/2019  . Adverse food reaction 10/06/2018  . Other allergic rhinitis 10/06/2018  . Chest pain 02/03/2017  . Costal margin pain 02/03/2017  . Back pain 02/03/2017  . Anxiety 02/03/2017  . Dizziness 02/03/2017  . 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 ALPRAZolam (XANAX) 0.25 MG tablet Take 1 tablet (0.25 mg total) by mouth 2 (two) times daily as needed for anxiety. 40 tablet 0  . fluticasone (FLONASE) 50 MCG/ACT nasal spray Place 2 sprays into both nostrils daily. 16 g 6  . Levonorgestrel (KYLEENA) 19.5 MG IUD Kyleena 17.5 mcg/24 hrs (41yrs) 19.5mg  intrauterine device  Take 1 device by intrauterine route.    . cefdinir (OMNICEF) 300 MG capsule Take 1 capsule (300 mg total) by mouth 2 (two) times daily. 20 capsule 0  . neomycin-polymyxin-hydrocortisone (CORTISPORIN) OTIC solution Place 3 drops into the left ear 3 (three) times daily. 10 mL 0   No current facility-administered medications for this visit.    Allergies: Pecan nut (diagnostic)  Past Medical History:  Diagnosis Date  . Back pain   . History of chicken pox   . History of kidney stones     No past surgical history on file.  Family History  Problem Relation Age of Onset  . Hypertension Mother   . Diabetes Mother   . Hypertension Father   . Pulmonary embolism Sister   . Multiple sclerosis Maternal Grandmother   . Heart disease Maternal Grandfather     Social History   Tobacco Use  . Smoking status: Never Smoker  . Smokeless tobacco: Never Used  Substance Use Topics  . Alcohol use: Yes    Alcohol/week: 4.0 standard drinks    Types: 1 Glasses of wine, 1 Cans of beer, 1 Shots of liquor, 1 Standard drinks or equivalent per week    Subjective:  Left ear pain x 1 week; has been using  Flonase; no fever, sore throat;  Objective:  Vitals:   07/26/19 1202  BP: 110/82  Pulse: 90  Temp: 98.7 F (37.1 C)  TempSrc: Oral  SpO2: 96%  Weight: 230 lb 3.2 oz (104.4 kg)    General: Well developed, well nourished, in no acute distress  Skin : Warm and dry.  Head: Normocephalic and atraumatic  Ears: External normal; canals clear; tympanic membranes full/ congested Oropharynx: Pink, supple. No suspicious lesions  Neck: Supple without thyromegaly, adenopathy  Lungs: Respirations unlabored;  Neurologic: Alert and oriented; speech intact; face symmetrical; moves all extremities well; CNII-XII intact without focal deficit   Assessment:  1. Acute otitis media, unspecified otitis media type     Plan:  Rx for Omnicef 300 mg bid x 10 days, Cortisporin Otic susp; follow-up worse, no better.  This visit occurred during the SARS-CoV-2 public health emergency.  Safety protocols were in place, including screening questions prior to the visit, additional usage of staff PPE, and extensive cleaning of exam room while observing appropriate contact time as indicated for disinfecting solutions.     No follow-ups on file.  No orders of the defined types were placed in this encounter.   Requested Prescriptions   Signed Prescriptions Disp Refills  . cefdinir (OMNICEF) 300 MG capsule 20 capsule 0    Sig: Take 1 capsule (300 mg total) by  mouth 2 (two) times daily.  Marland Kitchen neomycin-polymyxin-hydrocortisone (CORTISPORIN) OTIC solution 10 mL 0    Sig: Place 3 drops into the left ear 3 (three) times daily.

## 2019-08-23 ENCOUNTER — Telehealth (INDEPENDENT_AMBULATORY_CARE_PROVIDER_SITE_OTHER): Payer: BC Managed Care – PPO | Admitting: Family

## 2019-08-23 ENCOUNTER — Other Ambulatory Visit: Payer: Self-pay

## 2019-08-23 ENCOUNTER — Telehealth: Payer: Self-pay | Admitting: Family

## 2019-08-23 ENCOUNTER — Other Ambulatory Visit (INDEPENDENT_AMBULATORY_CARE_PROVIDER_SITE_OTHER): Payer: BC Managed Care – PPO

## 2019-08-23 ENCOUNTER — Encounter: Payer: Self-pay | Admitting: Family

## 2019-08-23 VITALS — Wt 205.0 lb

## 2019-08-23 DIAGNOSIS — R202 Paresthesia of skin: Secondary | ICD-10-CM | POA: Diagnosis not present

## 2019-08-23 DIAGNOSIS — R200 Anesthesia of skin: Secondary | ICD-10-CM

## 2019-08-23 DIAGNOSIS — F419 Anxiety disorder, unspecified: Secondary | ICD-10-CM

## 2019-08-23 DIAGNOSIS — R29818 Other symptoms and signs involving the nervous system: Secondary | ICD-10-CM | POA: Diagnosis not present

## 2019-08-23 LAB — CBC WITH DIFFERENTIAL/PLATELET
Basophils Absolute: 0 10*3/uL (ref 0.0–0.1)
Basophils Relative: 1.1 % (ref 0.0–3.0)
Eosinophils Absolute: 0.1 10*3/uL (ref 0.0–0.7)
Eosinophils Relative: 2.7 % (ref 0.0–5.0)
HCT: 42.4 % (ref 36.0–46.0)
Hemoglobin: 14.4 g/dL (ref 12.0–15.0)
Lymphocytes Relative: 33.4 % (ref 12.0–46.0)
Lymphs Abs: 1.4 10*3/uL (ref 0.7–4.0)
MCHC: 34.1 g/dL (ref 30.0–36.0)
MCV: 86.7 fl (ref 78.0–100.0)
Monocytes Absolute: 0.4 10*3/uL (ref 0.1–1.0)
Monocytes Relative: 9.4 % (ref 3.0–12.0)
Neutro Abs: 2.3 10*3/uL (ref 1.4–7.7)
Neutrophils Relative %: 53.4 % (ref 43.0–77.0)
Platelets: 153 10*3/uL (ref 150.0–400.0)
RBC: 4.88 Mil/uL (ref 3.87–5.11)
RDW: 13.3 % (ref 11.5–15.5)
WBC: 4.3 10*3/uL (ref 4.0–10.5)

## 2019-08-23 LAB — VITAMIN B12: Vitamin B-12: 800 pg/mL (ref 211–911)

## 2019-08-23 LAB — COMPREHENSIVE METABOLIC PANEL
ALT: 33 U/L (ref 0–35)
AST: 27 U/L (ref 0–37)
Albumin: 4.4 g/dL (ref 3.5–5.2)
Alkaline Phosphatase: 37 U/L — ABNORMAL LOW (ref 39–117)
BUN: 5 mg/dL — ABNORMAL LOW (ref 6–23)
CO2: 26 mEq/L (ref 19–32)
Calcium: 9.5 mg/dL (ref 8.4–10.5)
Chloride: 104 mEq/L (ref 96–112)
Creatinine, Ser: 0.58 mg/dL (ref 0.40–1.20)
GFR: 118.17 mL/min (ref >60.00)
Glucose, Bld: 91 mg/dL (ref 70–99)
Potassium: 3.8 mEq/L (ref 3.5–5.1)
Sodium: 142 mEq/L (ref 135–145)
Total Bilirubin: 0.4 mg/dL (ref 0.2–1.2)
Total Protein: 7.2 g/dL (ref 6.0–8.3)

## 2019-08-23 LAB — MAGNESIUM: Magnesium: 1.9 mg/dL (ref 1.5–2.5)

## 2019-08-23 LAB — TSH: TSH: 1.45 u[IU]/mL (ref 0.35–4.50)

## 2019-08-23 MED ORDER — ALPRAZOLAM 0.25 MG PO TABS: ORAL_TABLET | ORAL | 0 refills | Status: DC

## 2019-08-23 NOTE — Telephone Encounter (Signed)
Appointment has been made for today 7/6 for a VV.

## 2019-08-23 NOTE — Progress Notes (Signed)
Natalie Montgomery is a 34 y.o. female with the following history as recorded in EpicCare:  Patient Active Problem List   Diagnosis Date Noted  . Right knee pain 02/15/2019  . Adverse food reaction 10/06/2018  . Other allergic rhinitis 10/06/2018  . Chest pain 02/03/2017  . Costal margin pain 02/03/2017  . Back pain 02/03/2017  . Anxiety 02/03/2017  . Dizziness 02/03/2017  . Sacroiliac inflammation ([place]) 11/05/2015    Current Outpatient Medications  Medication Sig Dispense Refill  . ALPRAZolam (XANAX) 0.25 MG tablet 1-2 tablet po bid prn anxiety 40 tablet 0  . fluticasone (FLONASE) 50 MCG/ACT nasal spray Place 2 sprays into both nostrils daily. 16 g 6  . Levonorgestrel (KYLEENA) 19.5 MG IUD Kyleena 17.5 mcg/24 hrs (51yr) 19.'5mg'$  intrauterine device  Take 1 device by intrauterine route.    . neomycin-polymyxin-hydrocortisone (CORTISPORIN) OTIC solution Place 3 drops into the left ear 3 (three) times daily. 10 mL 0   No current facility-administered medications for this visit.    Allergies: Pecan nut (diagnostic)  Past Medical History:  Diagnosis Date  . Back pain   . History of chicken pox   . History of kidney stones     No past surgical history on file.  Family History  Problem Relation Age of Onset  . Hypertension Mother   . Diabetes Mother   . Hypertension Father   . Pulmonary embolism Sister   . Multiple sclerosis Maternal Grandmother   . Heart disease Maternal Grandfather     Social History   Tobacco Use  . Smoking status: Never Smoker  . Smokeless tobacco: Never Used  Substance Use Topics  . Alcohol use: Yes    Alcohol/week: 4.0 standard drinks    Types: 1 Glasses of wine, 1 Cans of beer, 1 Shots of liquor, 1 Standard drinks or equivalent per week    Subjective:    I connected with Natalie UPHAMon 08/23/19 at 10:00 AM EDT by a video enabled telemedicine application and verified that I am speaking with the correct person using two identifiers.   I  discussed the limitations of evaluation and management by telemedicine and the availability of in person appointments. The patient expressed understanding and agreed to proceed. Provider in office/ patient is at home; provider and patient are only 2 people on video call.   Patient had episode yesterday where she experienced sudden sensation of "numbness" in her face ( right side of face) and hands; notes that right hand "cramped" up yesterday during the episode- "could not open her hand."; EMT was called to the house yesterday and EKG was done which was normal; had a secondary panic attack secondary to the symptoms which responded to her Xanax; Has been working on losing weight- now exercising regularly; has changed eating routine- drinking (Slim Fast) shakes during the morning/ mid-day and eating salad at night; Per patient, weight is down 25 pounds this past month;     Objective:  Vitals:   08/23/19 1016  Weight: 205 lb (93 kg)    General: Well developed, well nourished, in no acute distress  Head: Normocephalic and atraumatic  Lungs: Respirations unlabored; clear to auscultation bilaterally without wheeze, rales, rhonchi  Neurologic: Alert and oriented; speech intact; face symmetrical;   Assessment:  1. Numbness and tingling   2. Other symptoms and signs involving the nervous system   3. Anxiety     Plan:  ? Etiology; update head CT and labs; follow-up to be determined- may need  to see neurology; Refill updated on Xanax as well due to history of panic attacks;     No follow-ups on file.  Orders Placed This Encounter  Procedures  . CT Head Wo Contrast    Standing Status:   Future    Standing Expiration Date:   08/22/2020    Order Specific Question:   Is patient pregnant?    Answer:   No    Order Specific Question:   Preferred imaging location?    Answer:   [address]    Order Specific Question:   Radiology Contrast Protocol - do NOT remove file path    Answer:    [email or web address]  . CBC w/Diff    Standing Status:   Future    Standing Expiration Date:   08/22/2020  . Comp Met (CMET)    Standing Status:   Future    Standing Expiration Date:   08/22/2020  . TSH    Standing Status:   Future    Standing Expiration Date:   08/22/2020  . Magnesium    Standing Status:   Future    Standing Expiration Date:   08/22/2020  . B12    Standing Status:   Future    Standing Expiration Date:   08/22/2020    Requested Prescriptions   Signed Prescriptions Disp Refills  . ALPRAZolam (XANAX) 0.25 MG tablet 40 tablet 0    Sig: 1-2 tablet po bid prn anxiety

## 2019-08-23 NOTE — Telephone Encounter (Signed)
New message:   Pt is calling and states the EMT had to come on to her house yesterday cause she was having a severe panic. She states they advised her to call and have her primary run some labs. Please advise.

## 2019-08-26 ENCOUNTER — Other Ambulatory Visit: Payer: Self-pay

## 2019-08-26 ENCOUNTER — Ambulatory Visit (INDEPENDENT_AMBULATORY_CARE_PROVIDER_SITE_OTHER)
Admission: RE | Admit: 2019-08-26 | Discharge: 2019-08-26 | Disposition: A | Discharge status: Home / Self Care | Payer: BC Managed Care – PPO | Source: Ambulatory Visit | Attending: Family | Admitting: Family

## 2019-08-26 DIAGNOSIS — R200 Anesthesia of skin: Secondary | ICD-10-CM | POA: Diagnosis not present

## 2019-08-26 DIAGNOSIS — R29818 Other symptoms and signs involving the nervous system: Secondary | ICD-10-CM

## 2019-08-30 ENCOUNTER — Other Ambulatory Visit: Payer: Self-pay | Admitting: Family

## 2019-08-30 ENCOUNTER — Inpatient Hospital Stay: Admission: RE | Admit: 2019-08-30 | Payer: BC Managed Care – PPO | Source: Ambulatory Visit

## 2019-08-30 ENCOUNTER — Encounter: Payer: Self-pay | Admitting: Family

## 2019-08-30 DIAGNOSIS — G5601 Carpal tunnel syndrome, right upper limb: Secondary | ICD-10-CM

## 2019-09-13 ENCOUNTER — Ambulatory Visit: Payer: BC Managed Care – PPO | Admitting: Orthopaedic Surgery

## 2019-09-13 ENCOUNTER — Encounter: Payer: Self-pay | Admitting: Orthopaedic Surgery

## 2019-09-13 DIAGNOSIS — G5602 Carpal tunnel syndrome, left upper limb: Secondary | ICD-10-CM | POA: Diagnosis not present

## 2019-09-13 DIAGNOSIS — G5601 Carpal tunnel syndrome, right upper limb: Secondary | ICD-10-CM | POA: Diagnosis not present

## 2019-09-13 MED ORDER — PREDNISONE 10 MG (21) PO TBPK
ORAL_TABLET | ORAL | 0 refills | Status: DC
Start: 2019-09-13 — End: 2020-11-05

## 2019-09-13 NOTE — Progress Notes (Signed)
Office Visit Note   Patient: Natalie Montgomery           Date of Birth: 04-12-1985           MRN: [id number] Visit Date: 09/13/2019              Requested by: Olive Bass, FNP [address] [place],  [place] [address] PCP: Olive Bass, FNP   Assessment & Plan: Visit Diagnoses:  1. Right carpal tunnel syndrome   2. Left carpal tunnel syndrome     Plan: Impression is bilateral carpal tunnel syndrome.  We will order nerve conduction studies at this point to assess the severity.  We will see her back after these studies have been completed.  In the meantime we have given her a prescription for a prednisone Dosepak.  Follow-Up Instructions: Return if symptoms worsen or fail to improve.   Orders:  No orders of the defined types were placed in this encounter.  Meds ordered this encounter  Medications  . predniSONE (STERAPRED UNI-PAK 21 TAB) 10 MG (21) TBPK tablet    Sig: Take as directed    Dispense:  21 tablet    Refill:  0      Procedures: No procedures performed   Clinical Data: No additional findings.   Subjective: Chief Complaint  Patient presents with  . Right Hand - Pain  . Left Hand - Pain    [name] is a 34 year old female comes in for bilateral wrist and hand pain.  She is unable to work due to her symptoms.  She has had pain for years.  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  She feels constant cramping in her hands.  She endorses swelling burning numbness tingling.  She does not feel that ice her wrist brace really helped.  She has been trying home exercises without relief.  Ibuprofen has given her temporary relief.   Review of Systems  Constitutional: Negative.   HENT: Negative.   Eyes: Negative.   Respiratory: Negative.   Cardiovascular: Negative.   Endocrine: Negative.   Musculoskeletal: Negative.   Neurological: Negative.   Hematological: Negative.   Psychiatric/Behavioral: Negative.   All other  systems reviewed and are negative.    Objective: Vital Signs: LMP 08/19/2019   Physical Exam Vitals and nursing note reviewed.  Constitutional:      Appearance: She is well-developed.  HENT:     Head: Normocephalic and atraumatic.  Pulmonary:     Effort: Pulmonary effort is normal.  Abdominal:     Palpations: Abdomen is soft.  Musculoskeletal:     Cervical back: Neck supple.  Skin:    General: Skin is warm.     Capillary Refill: Capillary refill takes less than 2 seconds.  Neurological:     Mental Status: She is alert and oriented to person, place, and time.  Psychiatric:        Behavior: Behavior normal.        Thought Content: Thought content normal.        Judgment: Judgment normal.     Ortho Exam Bilateral hands show no muscle atrophy or temperature changes.  Positive carpal tunnel compressive signs worse on the right.  No focal motor or sensory deficits. Specialty Comments:  No specialty comments available.  Imaging: No results found.   PMFS History: Patient Active Problem List   Diagnosis Date Noted  . Right knee pain 02/15/2019  . Adverse food reaction 10/06/2018  .  Other allergic rhinitis 10/06/2018  . Chest pain 02/03/2017  . Costal margin pain 02/03/2017  . Back pain 02/03/2017  . Anxiety 02/03/2017  . Dizziness 02/03/2017  . 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 Back pain   . History of chicken pox   . History of kidney stones     Family History  Problem Relation Age of Onset  . Hypertension Mother   . Diabetes Mother   . Hypertension Father   . Pulmonary embolism Sister   . Multiple sclerosis Maternal Grandmother   . Heart disease Maternal Grandfather     History reviewed. No pertinent surgical history. Social History   Occupational History  . Occupation: Seamstress  Tobacco Use  . Smoking status: Never Smoker  . Smokeless tobacco: Never Used  Vaping Use  . Vaping Use: Never used    Substance and Sexual Activity  . Alcohol use: Yes    Alcohol/week: 4.0 standard drinks    Types: 1 Glasses of wine, 1 Cans of beer, 1 Shots of liquor, 1 Standard drinks or equivalent per week  . Drug use: No  . Sexual activity: Yes    Birth control/protection: I.U.D.

## 2019-09-15 ENCOUNTER — Other Ambulatory Visit: Payer: Self-pay

## 2019-09-15 DIAGNOSIS — G5601 Carpal tunnel syndrome, right upper limb: Secondary | ICD-10-CM

## 2019-09-15 DIAGNOSIS — G5602 Carpal tunnel syndrome, left upper limb: Secondary | ICD-10-CM

## 2019-10-13 DIAGNOSIS — Z01419 Encounter for gynecological examination (general) (routine) without abnormal findings: Secondary | ICD-10-CM | POA: Diagnosis not present

## 2019-10-13 DIAGNOSIS — Z124 Encounter for screening for malignant neoplasm of cervix: Secondary | ICD-10-CM | POA: Diagnosis not present

## 2019-10-13 DIAGNOSIS — Z6827 Body mass index (BMI) 27.0-27.9, adult: Secondary | ICD-10-CM | POA: Diagnosis not present

## 2019-10-21 ENCOUNTER — Ambulatory Visit: Payer: BC Managed Care – PPO | Admitting: Physical Medicine and Rehabilitation

## 2019-10-21 ENCOUNTER — Other Ambulatory Visit: Payer: Self-pay

## 2019-10-21 DIAGNOSIS — R202 Paresthesia of skin: Secondary | ICD-10-CM

## 2019-10-21 NOTE — Progress Notes (Signed)
Pt state right wrist and hand pain. Pt states she has problems with both hands. 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 Pt state brushing her hair was uncomfortable. Right Dominant hand and pt feels Numbness.  Numeric Pain Rating Scale and Functional Assessment Average Pain 6   In the last MONTH (on 0-10 scale) has pain interfered with the following?  1. General activity like being  able to carry out your everyday physical activities such as walking, climbing stairs, carrying groceries, or moving a chair?  Rating(9)

## 2019-10-21 NOTE — Procedures (Signed)
EMG & NCV Findings: All nerve conduction studies (as indicated in the following tables) were within normal limits.  All left vs. right side differences were within normal limits.    All examined muscles (as indicated in the following table) showed no evidence of electrical instability.    Impression: Essentially NORMAL electrodiagnostic study of both upper limbs.  There is no significant electrodiagnostic evidence of nerve entrapment, brachial plexopathy, cervical radiculopathy or generalized peripheral neuropathy.  As you know, purely sensory or demyelinating radiculopathies and chemical radiculitis may not be detected with this particular electrodiagnostic study.  Recommendations: 1.  Follow-up with referring physician. 2.  Continue current management of symptoms.  Suggest diagnostic carpal tunnel injection.  ___________________________ Naaman Plummer FAAPMR Board Certified, American Board of Physical Medicine and Rehabilitation    Nerve Conduction Studies Anti Sensory Summary Table   Stim Site NR Peak (ms) Norm Peak (ms) P-T Amp (V) Norm P-T Amp Site1 Site2 Delta-P (ms) Dist (cm) Vel (m/s) Norm Vel (m/s)  Left Median Acr Palm Anti Sensory (2nd Digit)  31.2C  Wrist    2.9 <3.6 73.3 >10 Wrist Palm 1.3 0.0    Palm    1.6 <2.0 87.1         Right Median Acr Palm Anti Sensory (2nd Digit)  30.7C  Wrist    2.9 <3.6 56.0 >10 Wrist Palm 1.2 0.0    Palm    1.7 <2.0 58.7         Right Radial Anti Sensory (Base 1st Digit)  30.8C  Wrist    2.2 <3.1 35.2  Wrist Base 1st Digit 2.2 0.0    Right Ulnar Anti Sensory (5th Digit)  31C  Wrist    2.8 <3.7 56.0 >15.0 Wrist 5th Digit 2.8 14.0 50 >38   Motor Summary Table   Stim Site NR Onset (ms) Norm Onset (ms) O-P Amp (mV) Norm O-P Amp Site1 Site2 Delta-0 (ms) Dist (cm) Vel (m/s) Norm Vel (m/s)  Left Median Motor (Abd Poll Brev)  31.4C  Wrist    2.9 <4.2 8.2 >5 Elbow Wrist 3.7 19.0 51 >50  Elbow    6.6  8.5         Right Median Motor (Abd Poll  Brev)  30.8C  Wrist    2.9 <4.2 9.7 >5 Elbow Wrist 3.6 18.0 50 >50  Elbow    6.5  9.8         Right Ulnar Motor (Abd Dig Min)  31C  Wrist    2.7 <4.2 9.4 >3 B Elbow Wrist 2.9 19.5 67 >53  B Elbow    5.6  9.0  A Elbow B Elbow 1.1 9.5 86 >53  A Elbow    6.7  9.0          EMG   Side Muscle Nerve Root Ins Act Fibs Psw Amp Dur Poly Recrt Int Dennie Bible Comment  Right Abd Poll Brev Median C8-T1 Nml Nml Nml Nml Nml 0 Nml Nml   Right 1stDorInt Ulnar C8-T1 Nml Nml Nml Nml Nml 0 Nml Nml   Right PronatorTeres Median C6-7 Nml Nml Nml Nml Nml 0 Nml Nml   Right Biceps Musculocut C5-6 Nml Nml Nml Nml Nml 0 Nml Nml   Right Deltoid Axillary C5-6 Nml Nml Nml Nml Nml 0 Nml Nml     Nerve Conduction Studies Anti Sensory Left/Right Comparison   Stim Site L Lat (ms) R Lat (ms) L-R Lat (ms) L Amp (V) R Amp (V) L-R Amp (%) Site1 Site2  L Vel (m/s) R Vel (m/s) L-R Vel (m/s)  Median Acr Palm Anti Sensory (2nd Digit)  31.2C  Wrist 2.9 2.9 0.0 73.3 56.0 23.6 Wrist Palm     Palm 1.6 1.7 0.1 87.1 58.7 32.6       Radial Anti Sensory (Base 1st Digit)  30.8C  Wrist  2.2   35.2  Wrist Base 1st Digit     Ulnar Anti Sensory (5th Digit)  31C  Wrist  2.8   56.0  Wrist 5th Digit  50    Motor Left/Right Comparison   Stim Site L Lat (ms) R Lat (ms) L-R Lat (ms) L Amp (mV) R Amp (mV) L-R Amp (%) Site1 Site2 L Vel (m/s) R Vel (m/s) L-R Vel (m/s)  Median Motor (Abd Poll Brev)  31.4C  Wrist 2.9 2.9 0.0 8.2 9.7 15.5 Elbow Wrist 51 50 1  Elbow 6.6 6.5 0.1 8.5 9.8 13.3       Ulnar Motor (Abd Dig Min)  31C  Wrist  2.7   9.4  B Elbow Wrist  67   B Elbow  5.6   9.0  A Elbow B Elbow  86   A Elbow  6.7   9.0           Waveforms:

## 2019-10-26 ENCOUNTER — Encounter: Payer: Self-pay | Admitting: Physical Medicine and Rehabilitation

## 2019-10-26 NOTE — Progress Notes (Signed)
Natalie Montgomery - 34 y.o. female MRN [id number]  Date of birth: 19-Oct-1985  Office Visit Note: Visit Date: 10/21/2019 PCP: Olive Bass, FNP Referred by: Olive Bass,*  Subjective: Chief Complaint  Patient presents with  . Right Wrist - Numbness, Pain, Weakness  . Right Hand - Numbness, Pain, Weakness   HPI:  Natalie Montgomery is a 34 y.o. female who comes in today at the request of Dr. Glee Arvin for electrodiagnostic study of the Bilateral upper extremities.  Patient is Right hand dominant.  Patient reports mainly pain in the right wrist and hand but also problems with both hands.  She was recently seen by her primary care provider and treated with anti-inflammatory and bracing.  She works as a Neurosurgeon.  She reports pain has been severe enough that she has not been able to really work.  She was then seen by Dr. Glee Arvin who referred her here for electrodiagnostic testing.  She has not had prior electrodiagnostic testing.  No history of diabetes.  No real complaints of radicular type pain.  She reports when trying to so she gets a lot of pain in the wrist with numbness and tingling particularly in the first 3 digits.  In general activities involving movement of the hand makes symptoms worse.  Average pain is 6 out of 10.  ROS Otherwise per HPI.  Assessment & Plan: Visit Diagnoses:  1. Paresthesia of skin     Plan: Impression: Essentially NORMAL electrodiagnostic study of both upper limbs.  There is no significant electrodiagnostic evidence of nerve entrapment, brachial plexopathy, cervical radiculopathy or generalized peripheral neuropathy.  As you know, purely sensory or demyelinating radiculopathies and chemical radiculitis may not be detected with this particular electrodiagnostic study.  Recommendations: 1.  Follow-up with referring physician. 2.  Continue current management of symptoms.  Suggest diagnostic carpal tunnel injection.  Meds & Orders: No orders  of the defined types were placed in this encounter.   Orders Placed This Encounter  Procedures  . NCV with EMG (electromyography)    Follow-up: Return in about 2 weeks (around 11/04/2019) for  Glee Arvin, M.D..   Procedures: No procedures performed  EMG & NCV Findings: All nerve conduction studies (as indicated in the following tables) were within normal limits.  All left vs. right side differences were within normal limits.    All examined muscles (as indicated in the following table) showed no evidence of electrical instability.    Impression: Essentially NORMAL electrodiagnostic study of both upper limbs.  There is no significant electrodiagnostic evidence of nerve entrapment, brachial plexopathy, cervical radiculopathy or generalized peripheral neuropathy.  As you know, purely sensory or demyelinating radiculopathies and chemical radiculitis may not be detected with this particular electrodiagnostic study.  Recommendations: 1.  Follow-up with referring physician. 2.  Continue current management of symptoms.  Suggest diagnostic carpal tunnel injection.  ___________________________ Naaman Plummer FAAPMR Board Certified, American Board of Physical Medicine and Rehabilitation    Nerve Conduction Studies Anti Sensory Summary Table   Stim Site NR Peak (ms) Norm Peak (ms) P-T Amp (V) Norm P-T Amp Site1 Site2 Delta-P (ms) Dist (cm) Vel (m/s) Norm Vel (m/s)  Left Median Acr Palm Anti Sensory (2nd Digit)  31.2C  Wrist    2.9 <3.6 73.3 >10 Wrist Palm 1.3 0.0    Palm    1.6 <2.0 87.1         Right Median Acr Palm Anti Sensory (2nd Digit)  30.7C  Wrist  2.9 <3.6 56.0 >10 Wrist Palm 1.2 0.0    Palm    1.7 <2.0 58.7         Right Radial Anti Sensory (Base 1st Digit)  30.8C  Wrist    2.2 <3.1 35.2  Wrist Base 1st Digit 2.2 0.0    Right Ulnar Anti Sensory (5th Digit)  31C  Wrist    2.8 <3.7 56.0 >15.0 Wrist 5th Digit 2.8 14.0 50 >38   Motor Summary Table   Stim Site NR Onset (ms)  Norm Onset (ms) O-P Amp (mV) Norm O-P Amp Site1 Site2 Delta-0 (ms) Dist (cm) Vel (m/s) Norm Vel (m/s)  Left Median Motor (Abd Poll Brev)  31.4C  Wrist    2.9 <4.2 8.2 >5 Elbow Wrist 3.7 19.0 51 >50  Elbow    6.6  8.5         Right Median Motor (Abd Poll Brev)  30.8C  Wrist    2.9 <4.2 9.7 >5 Elbow Wrist 3.6 18.0 50 >50  Elbow    6.5  9.8         Right Ulnar Motor (Abd Dig Min)  31C  Wrist    2.7 <4.2 9.4 >3 B Elbow Wrist 2.9 19.5 67 >53  B Elbow    5.6  9.0  A Elbow B Elbow 1.1 9.5 86 >53  A Elbow    6.7  9.0          EMG   Side Muscle Nerve Root Ins Act Fibs Psw Amp Dur Poly Recrt Int Dennie Bible Comment  Right Abd Poll Brev Median C8-T1 Nml Nml Nml Nml Nml 0 Nml Nml   Right 1stDorInt Ulnar C8-T1 Nml Nml Nml Nml Nml 0 Nml Nml   Right PronatorTeres Median C6-7 Nml Nml Nml Nml Nml 0 Nml Nml   Right Biceps Musculocut C5-6 Nml Nml Nml Nml Nml 0 Nml Nml   Right Deltoid Axillary C5-6 Nml Nml Nml Nml Nml 0 Nml Nml     Nerve Conduction Studies Anti Sensory Left/Right Comparison   Stim Site L Lat (ms) R Lat (ms) L-R Lat (ms) L Amp (V) R Amp (V) L-R Amp (%) Site1 Site2 L Vel (m/s) R Vel (m/s) L-R Vel (m/s)  Median Acr Palm Anti Sensory (2nd Digit)  31.2C  Wrist 2.9 2.9 0.0 73.3 56.0 23.6 Wrist Palm     Palm 1.6 1.7 0.1 87.1 58.7 32.6       Radial Anti Sensory (Base 1st Digit)  30.8C  Wrist  2.2   35.2  Wrist Base 1st Digit     Ulnar Anti Sensory (5th Digit)  31C  Wrist  2.8   56.0  Wrist 5th Digit  50    Motor Left/Right Comparison   Stim Site L Lat (ms) R Lat (ms) L-R Lat (ms) L Amp (mV) R Amp (mV) L-R Amp (%) Site1 Site2 L Vel (m/s) R Vel (m/s) L-R Vel (m/s)  Median Motor (Abd Poll Brev)  31.4C  Wrist 2.9 2.9 0.0 8.2 9.7 15.5 Elbow Wrist 51 50 1  Elbow 6.6 6.5 0.1 8.5 9.8 13.3       Ulnar Motor (Abd Dig Min)  31C  Wrist  2.7   9.4  B Elbow Wrist  67   B Elbow  5.6   9.0  A Elbow B Elbow  86   A Elbow  6.7   9.0           Waveforms:  Clinical  History: No specialty comments available.     Objective:  VS:  HT:    WT:   BMI:     BP:   HR: bpm  TEMP: ( )  RESP:  Physical Exam Musculoskeletal:        General: No swelling, tenderness or deformity.     Comments: Inspection reveals no atrophy of the bilateral APB or FDI or hand intrinsics. There is no swelling, color changes, allodynia or dystrophic changes. There is 5 out of 5 strength in the bilateral wrist extension, finger abduction and long finger flexion. There is intact sensation to light touch in all dermatomal and peripheral nerve distributions.  There is a negative Hoffmann's test bilaterally.  Skin:    General: Skin is warm and dry.     Findings: No erythema or rash.  Neurological:     General: No focal deficit present.     Mental Status: She is alert and oriented to person, place, and time.     Motor: No weakness or abnormal muscle tone.     Coordination: Coordination normal.  Psychiatric:        Mood and Affect: Mood normal.        Behavior: Behavior normal.      Imaging: No results found.

## 2019-11-02 DIAGNOSIS — Z6826 Body mass index (BMI) 26.0-26.9, adult: Secondary | ICD-10-CM | POA: Diagnosis not present

## 2019-11-02 DIAGNOSIS — R1031 Right lower quadrant pain: Secondary | ICD-10-CM | POA: Diagnosis not present

## 2019-11-02 DIAGNOSIS — N6310 Unspecified lump in the right breast, unspecified quadrant: Secondary | ICD-10-CM | POA: Diagnosis not present

## 2019-11-03 ENCOUNTER — Encounter: Payer: Self-pay | Admitting: Orthopaedic Surgery

## 2019-11-03 ENCOUNTER — Ambulatory Visit: Payer: BC Managed Care – PPO | Admitting: Orthopaedic Surgery

## 2019-11-03 VITALS — Ht 68.0 in | Wt 170.0 lb

## 2019-11-03 DIAGNOSIS — G5601 Carpal tunnel syndrome, right upper limb: Secondary | ICD-10-CM

## 2019-11-03 DIAGNOSIS — G5602 Carpal tunnel syndrome, left upper limb: Secondary | ICD-10-CM | POA: Diagnosis not present

## 2019-11-03 MED ORDER — LIDOCAINE HCL 1 % IJ SOLN
0.3000 mL | INTRAMUSCULAR | Status: AC | PRN
  Administered 2019-11-03: .3 mL

## 2019-11-03 MED ORDER — METHYLPREDNISOLONE ACETATE 40 MG/ML IJ SUSP
13.3300 mg | INTRAMUSCULAR | Status: AC | PRN
  Administered 2019-11-03: 13.33 mg

## 2019-11-03 MED ORDER — BUPIVACAINE HCL 0.25 % IJ SOLN
0.3300 mL | INTRAMUSCULAR | Status: AC | PRN
  Administered 2019-11-03: .33 mL

## 2019-11-03 NOTE — Progress Notes (Signed)
Office Visit Note   Patient: Natalie Montgomery           Date of Birth: 02-02-1986           MRN: [id number] Visit Date: 11/03/2019              Requested by: Olive Bass, FNP [address] [place],  [place] [address] PCP: Olive Bass, FNP   Assessment & Plan: Visit Diagnoses:  1. Carpal tunnel syndrome on right   2. Carpal tunnel syndrome on left     Plan: Impression is possible bilateral carpal tunnel syndrome right greater than left versus cervical spine radiculopathy.  We will proceed with a diagnostic cortisone injection to the right, more symptomatic carpal tunnel today.  She will give this at least 2 weeks to notice if there is symptom improvement.  If she has no improvement, she will follow up with [place] we will likely look further into her neck.  Follow-Up Instructions: Return if symptoms worsen or fail to improve.   Orders:  Orders Placed This Encounter  Procedures  . Hand/UE Inj: R carpal tunnel   No orders of the defined types were placed in this encounter.     Procedures: Hand/UE Inj: R carpal tunnel for carpal tunnel syndrome on 11/03/2019 9:39 AM Indications: pain Details: 25 G needle, volar approach Medications: 0.3 mL lidocaine 1 %; 0.33 mL bupivacaine 0.25 %; 13.33 mg methylPREDNISolone acetate 40 MG/ML      Clinical Data: No additional findings.   Subjective: Chief Complaint  Patient presents with  . Left Hand - Follow-up    EMG review  . Right Hand - Follow-up    HPI patient is a pleasant 34 year old female who comes in today following nerve conduction studies bilateral upper extremities by Dr. Alvester Morin.  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  She was sent for nerve conduction study to both upper extremities which showed normal exams.  She continues to have the symptoms.  She  also notes occasional symptoms at night.  She does occasionally wear wrist splints which sometimes help.  Review of Systems as detailed in HPI.  All others reviewed and are negative.   Objective: Vital Signs: Ht 5\' 8"  (1.727 m)   Wt 170 lb (77.1 kg)   BMI 25.85 kg/m   Physical Exam well-developed well-nourished female no acute distress.  Alert and oriented x3.  Ortho Exam stable bilateral wrist exams  Specialty Comments:  No specialty comments available.  Imaging: No new imaging   PMFS History: Patient Active Problem List   Diagnosis Date Noted  . Carpal tunnel syndrome, right upper limb 11/03/2019  . Carpal tunnel syndrome on left 11/03/2019  . Right knee pain 02/15/2019  . Adverse food reaction 10/06/2018  . Other allergic rhinitis 10/06/2018  . Chest pain 02/03/2017  . Costal margin pain 02/03/2017  . Back pain 02/03/2017  . Anxiety 02/03/2017  . Dizziness 02/03/2017  . 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 Back pain   . History of chicken pox   . History of kidney stones     Family History  Problem Relation Age of Onset  . Hypertension Mother   . Diabetes Mother   . Hypertension Father   . Pulmonary embolism Sister   . Multiple sclerosis Maternal Grandmother   .  Heart disease Maternal Grandfather     History reviewed. No pertinent surgical history. Social History   Occupational History  . Occupation: Seamstress  Tobacco Use  . Smoking status: Never Smoker  . Smokeless tobacco: Never Used  Vaping Use  . Vaping Use: Never used  Substance and Sexual Activity  . Alcohol use: Yes    Alcohol/week: 4.0 standard drinks    Types: 1 Glasses of wine, 1 Cans of beer, 1 Shots of liquor, 1 Standard drinks or equivalent per week  . Drug use: No  . Sexual activity: Yes    Birth control/protection: I.U.D.

## 2019-11-04 ENCOUNTER — Other Ambulatory Visit: Payer: Self-pay | Admitting: Obstetrics and Gynecology

## 2019-11-04 DIAGNOSIS — N6310 Unspecified lump in the right breast, unspecified quadrant: Secondary | ICD-10-CM

## 2019-11-15 DIAGNOSIS — Z20828 Contact with and (suspected) exposure to other viral communicable diseases: Secondary | ICD-10-CM | POA: Diagnosis not present

## 2019-11-18 ENCOUNTER — Ambulatory Visit
Admission: RE | Admit: 2019-11-18 | Discharge: 2019-11-18 | Disposition: A | Discharge status: Home / Self Care | Payer: BC Managed Care – PPO | Source: Ambulatory Visit | Attending: Obstetrics and Gynecology | Admitting: Obstetrics and Gynecology

## 2019-11-18 ENCOUNTER — Other Ambulatory Visit: Payer: Self-pay

## 2019-11-18 DIAGNOSIS — N6489 Other specified disorders of breast: Secondary | ICD-10-CM | POA: Diagnosis not present

## 2019-11-18 DIAGNOSIS — R928 Other abnormal and inconclusive findings on diagnostic imaging of breast: Secondary | ICD-10-CM | POA: Diagnosis not present

## 2019-11-18 DIAGNOSIS — N6310 Unspecified lump in the right breast, unspecified quadrant: Secondary | ICD-10-CM

## 2020-03-26 DIAGNOSIS — F411 Generalized anxiety disorder: Secondary | ICD-10-CM | POA: Diagnosis not present

## 2020-04-05 DIAGNOSIS — F411 Generalized anxiety disorder: Secondary | ICD-10-CM | POA: Diagnosis not present

## 2020-04-12 DIAGNOSIS — F411 Generalized anxiety disorder: Secondary | ICD-10-CM | POA: Diagnosis not present

## 2020-04-19 DIAGNOSIS — F411 Generalized anxiety disorder: Secondary | ICD-10-CM | POA: Diagnosis not present

## 2020-04-26 DIAGNOSIS — F411 Generalized anxiety disorder: Secondary | ICD-10-CM | POA: Diagnosis not present

## 2020-05-24 DIAGNOSIS — F411 Generalized anxiety disorder: Secondary | ICD-10-CM | POA: Diagnosis not present

## 2020-07-24 ENCOUNTER — Other Ambulatory Visit: Payer: Self-pay | Admitting: Family

## 2020-07-24 NOTE — Telephone Encounter (Signed)
Please call and find out what her plans are for continued care. I last saw her in July 2021 so I can refill the Xanax 1 more time. Does she want me to get her set up to see Dr. Lawerance Bach at [facility] or is she okay to come here?

## 2020-07-26 ENCOUNTER — Telehealth: Payer: Self-pay | Admitting: Family

## 2020-07-26 MED ORDER — ALPRAZOLAM 0.25 MG PO TABS: ORAL_TABLET | ORAL | 0 refills | Status: DC

## 2020-07-26 NOTE — Telephone Encounter (Signed)
Patient would like to stay at [facility].

## 2020-07-26 NOTE — Telephone Encounter (Signed)
    Left message vcml for patient to call for [facility] appointment with Dr Lawerance Bach

## 2020-08-02 DIAGNOSIS — F411 Generalized anxiety disorder: Secondary | ICD-10-CM | POA: Diagnosis not present

## 2020-08-16 ENCOUNTER — Emergency Department
Admission: RE | Admit: 2020-08-16 | Discharge: 2020-08-16 | Disposition: A | Discharge status: Home / Self Care | Payer: BC Managed Care – PPO | Source: Ambulatory Visit

## 2020-08-16 ENCOUNTER — Other Ambulatory Visit: Payer: Self-pay

## 2020-08-16 VITALS — BP 113/79 | HR 109 | Temp 99.8°F | Resp 17 | Ht 68.0 in | Wt 210.0 lb

## 2020-08-16 DIAGNOSIS — J309 Allergic rhinitis, unspecified: Secondary | ICD-10-CM

## 2020-08-16 DIAGNOSIS — J069 Acute upper respiratory infection, unspecified: Secondary | ICD-10-CM | POA: Diagnosis not present

## 2020-08-16 DIAGNOSIS — R509 Fever, unspecified: Secondary | ICD-10-CM

## 2020-08-16 MED ORDER — METHYLPREDNISOLONE 4 MG PO TBPK: ORAL_TABLET | ORAL | 0 refills | Status: DC

## 2020-08-16 MED ORDER — BENZONATATE 200 MG PO CAPS: 200.0000 mg | ORAL_CAPSULE | Freq: Three times a day (TID) | ORAL | 0 refills | Status: AC | PRN

## 2020-08-16 MED ORDER — ACETAMINOPHEN 500 MG PO TABS
1000.0000 mg | ORAL_TABLET | Freq: Once | ORAL | Status: AC
  Administered 2020-08-16: 1000 mg via ORAL

## 2020-08-16 MED ORDER — FEXOFENADINE HCL 180 MG PO TABS: 180.0000 mg | ORAL_TABLET | Freq: Every day | ORAL | 0 refills | Status: DC

## 2020-08-16 NOTE — ED Provider Notes (Signed)
[facility]    CSN: [id number] Arrival date & time: 08/16/20  1452      History   Chief Complaint Chief Complaint  Patient presents with   Otalgia   Cough    HPI Natalie Montgomery is a 35 y.o. female.   HPI 35 year old female presents with sore throat for 2 days cough for 1 day and bilateral ear pain for days.  Patient taking 3 COVID-19 home test all negative.  Patient is vaccinated for COVID-19 with booster.  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 No pertinent surgical history.  OB History   No obstetric history on file.      Home Medications    Prior to Admission medications   Medication Sig Start Date End Date Taking? Authorizing Provider  benzonatate (TESSALON) 200 MG capsule Take 1 capsule (200 mg total) by mouth 3 (three) times daily as needed for up to 7 days for cough. 08/16/20 08/23/20 Yes Trevor Iha, FNP  fexofenadine [facility] ALLERGY) 180 MG tablet Take 1 tablet (180 mg total) by mouth daily for 15 days. 08/16/20 08/31/20 Yes Trevor Iha, FNP  fluticasone (FLONASE) 50 MCG/ACT nasal spray Place 2 sprays into both nostrils daily. 12/03/18  Yes Olive Bass, FNP  methylPREDNISolone (MEDROL DOSEPAK) 4 MG TBPK tablet Take as directed. 08/16/20  Yes Trevor Iha, FNP  ALPRAZolam Prudy Feeler) 0.25 MG tablet 1-2 tablet po bid prn anxiety 07/26/20   Olive Bass, FNP  Levonorgestrel [facility]) 19.5 MG IUD Kyleena 17.5 mcg/24  hrs (77yrs) 19.5mg  intrauterine device  Take 1 device by intrauterine route.    [provider]  neomycin-polymyxin-hydrocortisone (CORTISPORIN) OTIC solution Place 3 drops into the left ear 3 (three) times daily. Patient not taking: Reported on 08/16/2020 07/26/19   Olive Bass, FNP  predniSONE (STERAPRED UNI-PAK 21 TAB) 10 MG (21) TBPK tablet Take as directed Patient not taking: Reported on 08/16/2020 09/13/19   Tarry Kos, MD    Family History Family History  Problem Relation Age of Onset   Hypertension Mother    Diabetes Mother    Hypertension Father    Pulmonary embolism Sister    Multiple sclerosis Maternal Grandmother    Heart disease Maternal Grandfather     Social History Social History   Tobacco Use   Smoking status: Never   Smokeless tobacco: Never  Vaping Use   Vaping Use: Never used  Substance Use Topics   Alcohol use: Yes    Alcohol/week: 4.0 standard drinks    Types: 1 Glasses of wine, 1 Cans of beer, 1 Shots of liquor, 1 Standard drinks or equivalent per week   Drug use: No     Allergies   Shellfish allergy and Pecan nut (diagnostic)   Review of Systems Review of Systems  Constitutional:  Positive for fever.  HENT:  Positive for ear pain and sore throat.   Respiratory:  Positive for cough.  All other systems reviewed and are negative.   Physical Exam Triage Vital Signs ED Triage Vitals  Enc Vitals Group     BP 08/16/20 1525 113/79     Pulse Rate 08/16/20 1525 (!) 109     Resp 08/16/20 1525 17     Temp 08/16/20 1525 99.8 F (37.7 C)     Temp Source 08/16/20 1525 Oral     SpO2 08/16/20 1525 97 %     Weight 08/16/20 1526 210 lb (95.3 kg)     Height 08/16/20 1526 5\' 8"  (1.727 m)     Head Circumference --      Peak Flow --      Pain Score 08/16/20 1526 6     Pain Loc --      Pain Edu? --      Excl. in GC? --    No data found.  Updated Vital Signs BP 113/79 (BP Location: Right Arm)   Pulse (!) 109   Temp 99.8 F (37.7  C) (Oral)   Resp 17   Ht 5\' 8"  (1.727 m)   Wt 210 lb (95.3 kg)   SpO2 97%   BMI 31.93 kg/m      Physical Exam Constitutional:      General: She is not in acute distress.    Appearance: Normal appearance. She is obese. She is ill-appearing.  HENT:     Head: Normocephalic and atraumatic.     Right Ear: Tympanic membrane, ear canal and external ear normal.     Left Ear: Tympanic membrane, ear canal and external ear normal.     Nose:     Right Turbinates: Enlarged.     Left Turbinates: Enlarged.     Comments: Turbinates are erythematous    Mouth/Throat:     Lips: Pink.     Mouth: Mucous membranes are moist.     Pharynx: Oropharynx is clear. Uvula midline.     Comments: Moderate amount of clear drainage of posterior oropharynx noted Eyes:     Extraocular Movements: Extraocular movements intact.     Conjunctiva/sclera: Conjunctivae normal.     Pupils: Pupils are equal, round, and reactive to light.  Cardiovascular:     Rate and Rhythm: Tachycardia present.     Pulses: Normal pulses.     Heart sounds: Normal heart sounds.  Pulmonary:     Effort: Pulmonary effort is normal. No respiratory distress.     Breath sounds: No stridor. Wheezing present. No rhonchi or rales.     Comments: Infrequent nonproductive cough noted on exam Musculoskeletal:        General: Normal range of motion.     Cervical back: Normal range of motion and neck supple. Tenderness present.  Lymphadenopathy:     Cervical: Cervical adenopathy present.  Skin:    General: Skin is warm and dry.  Neurological:     General: No focal deficit present.     Mental Status: She is alert and oriented to person, place, and time.  Psychiatric:        Mood and Affect: Mood normal.        Behavior: Behavior normal.     UC Treatments / Results  Labs (all labs ordered are listed, but only abnormal results are displayed) Labs Reviewed  COVID-19, FLU A+B NAA    EKG   Radiology No results  found.  Procedures Procedures (including critical care time)  Medications Ordered in UC Medications  acetaminophen (TYLENOL) tablet 1,000 mg (1,000 mg  Oral Given 08/16/20 1642)    Initial Impression / Assessment and Plan / UC Course  I have reviewed the triage vital signs and the nursing notes.  Pertinent labs & imaging results that were available during my care of the patient were reviewed by me and considered in my medical decision making (see chart for details).    MDM: 1. Fever-1000 mg acetaminophen given once in clinic today, COVID-19/flu A&B ordered, 2.  Viral URI with cough-Rx'd Medrol Dosepak, Tessalon Perles, 3.  Allergic rhinitis-Rx'd Allegra.  Discharged home, hemodynamically stable. Final Clinical Impressions(s) / UC Diagnoses   Final diagnoses:  Fever, unspecified  Viral URI with cough  Allergic rhinitis, unspecified seasonality, unspecified trigger     Discharge Instructions      Patient may take Allegra 180 mg daily for the next 5 to 7 days, then as needed.  Advised may take Tessalon Perles 3-4 times daily, as needed.  Advised may use Tylenol 1000 mg or Ibuprofen 800 mg for fever/myalgias.  Instructed patient not to start Medrol Dosepak until tomorrow, Friday, 08/17/2020 advised please take with food as directed to completion.  Advised we will follow-up with COVID-19/flu AB results once received.     ED Prescriptions     Medication Sig Dispense Auth. Provider   methylPREDNISolone (MEDROL DOSEPAK) 4 MG TBPK tablet Take as directed. 1 each Trevor Iha, FNP   fexofenadine [facility] ALLERGY) 180 MG tablet Take 1 tablet (180 mg total) by mouth daily for 15 days. 15 tablet Trevor Iha, FNP   benzonatate (TESSALON) 200 MG capsule Take 1 capsule (200 mg total) by mouth 3 (three) times daily as needed for up to 7 days for cough. 30 capsule Trevor Iha, FNP      PDMP not reviewed this encounter.   Trevor Iha, FNP 08/16/20 [phone]

## 2020-08-16 NOTE — Discharge Instructions (Addendum)
Patient may take Allegra 180 mg daily for the next 5 to 7 days, then as needed.  Advised may take Tessalon Perles 3-4 times daily, as needed.  Advised may use Tylenol 1000 mg or Ibuprofen 800 mg for fever/myalgias.  Instructed patient not to start Medrol Dosepak until tomorrow, Friday, 08/17/2020 advised please take with food as directed to completion.  Advised we will follow-up with COVID-19/flu AB results once received.

## 2020-08-16 NOTE — ED Triage Notes (Signed)
Sore throat since Tuesday  Cough since yesterday  Bilateral ear pain  Temp 100 yesterday evening Nyquil last night & this am  Home COVID tests x 3 -last one was yesterday COVID vaccine +booster

## 2020-08-17 ENCOUNTER — Ambulatory Visit: Payer: BC Managed Care – PPO | Admitting: Family

## 2020-08-18 LAB — COVID-19, FLU A+B NAA
Influenza A, NAA: NOT DETECTED
Influenza B, NAA: NOT DETECTED
SARS-CoV-2, NAA: NOT DETECTED

## 2020-11-02 NOTE — Progress Notes (Signed)
I, Natalie Montgomery, LAT, ATC, am serving as scribe for Dr. Clementeen Graham.  Natalie Montgomery is a 35 y.o. female who presents to [facility] at [facility] today for f/u of R knee pain that began on 02/07/19 when she twisted her knee.  She was last seen by Dr. Denyse Amass on 02/23/19 and had a R knee injection.  She was also advised to use a knee compression sleeve and Voltaren gel.  Since then, pt reports that her R knee pain returned about 6 months ago and has progressively worsened recently.  She locates her current pain more to her R medial knee but will also get pain laterally.  Aggravating factors include walking, driving and climbing stairs.  She likes to walk for exercise and has been unable to do so as she is limping.  She has been icing her knee, wearing a knee brace and taking IBU.    In January 2021 she was seen she was thought to have a possible lateral meniscus tear with lateral parameniscal cysts.   Pertinent review of systems: No fever or chills  Relevant historical information: Carpal tunnel syndrome.  Patient works as a Neurosurgeon.   Exam:  BP 120/84 (BP Location: Left Arm, Patient Position: Sitting, Cuff Size: Normal)   Pulse 91   Ht 5\' 8"  (1.727 m)   Wt 218 lb 6.4 oz (99.1 kg)   SpO2 96%   BMI 33.21 kg/m  General: Well Developed, well nourished, and in no acute distress.   MSK: Right knee normal-appearing Mildly tender palpation medial joint line. Knee motion with crepitation. Stable ligamentous exam. Negative to equivocal McMurray's test. Intact strength.     Lab and Radiology Results  Procedure: Real-time Ultrasound Guided Injection of right knee superior lateral patellar space Device: Philips Affiniti 50G Images permanently stored and available for review in PACS Ultrasound evaluation prior to injection reveals no obvious large lateral parameniscal cysts.  Narrowed medial joint line.  No Baker's cyst. Verbal informed consent obtained.   Discussed risks and benefits of procedure. Warned about infection bleeding damage to structures skin hypopigmentation and fat atrophy among others. Patient expresses understanding and agreement Time-out conducted.   Noted no overlying erythema, induration, or other signs of local infection.   Skin prepped in a sterile fashion.   Local anesthesia: Topical Ethyl chloride.   With sterile technique and under real time ultrasound guidance: 40 mg of Kenalog and 2 mL of lidocaine injected into knee joint. Fluid seen entering the joint capsule.   Completed without difficulty   Pain immediately resolved suggesting accurate placement of the medication.   Advised to call if fevers/chills, erythema, induration, drainage, or persistent bleeding.   Images permanently stored and available for review in the ultrasound unit.  Impression: Technically successful ultrasound guided injection.    X-ray images right knee obtained today personally and independently interpreted Minimal medial compartment DJD.  No acute fractures. Await formal radiology review    Assessment and Plan: 35 y.o. female with right knee pain thought to be due to degenerative meniscus tear versus medial compartment DJD.31  Plan for trial of physical therapy and steroid injection.  Recheck in 6 weeks.  If not improved next step will likely be MRI for potential surgical planning.  Recommend Voltaren gel.    PDMP not reviewed this encounter. Orders Placed This Encounter  Procedures   Marland Kitchen LIMITED JOINT SPACE STRUCTURES LOW RIGHT(NO LINKED CHARGES)    Order Specific Question:   Reason for Exam (SYMPTOM  OR DIAGNOSIS REQUIRED)    Answer:   R knee pain    Order Specific Question:   Preferred imaging location?    Answer:   [facility] Knee AP/LAT W/[place] Right    Standing Status:   Future    Standing Expiration Date:   11/05/2021    Order Specific Question:   Reason for Exam (SYMPTOM  OR DIAGNOSIS REQUIRED)     Answer:   eval knee pain    Order Specific Question:   Is patient pregnant?    Answer:   No    Order Specific Question:   Preferred imaging location?    Answer:   [facility]   Ambulatory referral to Physical Therapy    Referral Priority:   Routine    Referral Type:   Physical Medicine    Referral Reason:   Specialty Services Required    Requested Specialty:   Physical Therapy    Number of Visits Requested:   1   No orders of the defined types were placed in this encounter.    Discussed warning signs or symptoms. Please see discharge instructions. Patient expresses understanding.   The above documentation has been reviewed and is accurate and complete Clementeen Graham, M.D.

## 2020-11-05 ENCOUNTER — Ambulatory Visit (INDEPENDENT_AMBULATORY_CARE_PROVIDER_SITE_OTHER): Payer: BC Managed Care – PPO

## 2020-11-05 ENCOUNTER — Ambulatory Visit: Payer: Self-pay

## 2020-11-05 ENCOUNTER — Ambulatory Visit: Payer: BC Managed Care – PPO | Admitting: Family Medicine

## 2020-11-05 ENCOUNTER — Encounter: Payer: Self-pay | Admitting: Family Medicine

## 2020-11-05 ENCOUNTER — Other Ambulatory Visit: Payer: Self-pay

## 2020-11-05 VITALS — BP 120/84 | HR 91 | Ht 68.0 in | Wt 218.4 lb

## 2020-11-05 DIAGNOSIS — M25561 Pain in right knee: Secondary | ICD-10-CM

## 2020-11-05 NOTE — Progress Notes (Signed)
Right knee x-ray looks normal to radiology

## 2020-11-05 NOTE — Patient Instructions (Addendum)
Thank you for coming in today.   You had a R knee injection.  Call or go to the ER if you develop a large red swollen joint with extreme pain or oozing puss.    Please get an Xray today before you leave   I've referred you to Physical Therapy.  Let us know if you don't hear from them in one week.  Please use Voltaren gel (Generic Diclofenac Gel) up to 4x daily for pain as needed.  This is available over-the-counter as both the name brand Voltaren gel and the generic diclofenac gel.   Recheck in 6 weeks.

## 2020-11-21 ENCOUNTER — Other Ambulatory Visit: Payer: Self-pay

## 2020-11-21 ENCOUNTER — Ambulatory Visit: Payer: BC Managed Care – PPO | Admitting: Physical Therapy

## 2020-11-21 ENCOUNTER — Encounter: Payer: Self-pay | Admitting: Physical Therapy

## 2020-11-21 DIAGNOSIS — M6281 Muscle weakness (generalized): Secondary | ICD-10-CM

## 2020-11-21 DIAGNOSIS — M25561 Pain in right knee: Secondary | ICD-10-CM

## 2020-11-21 DIAGNOSIS — R262 Difficulty in walking, not elsewhere classified: Secondary | ICD-10-CM | POA: Diagnosis not present

## 2020-11-21 NOTE — Patient Instructions (Signed)
Access Code: [email or web address] URL: [email or web address] Date: 11/21/2020 Prepared by: Ivery Quale  Exercises Standing Gastroc Stretch - 2 x daily - 6 x weekly - 1 sets - 3 reps - 30 hold Standing Soleus Stretch - 2 x daily - 6 x weekly - 1 sets - 3 reps - 30 hold Seated Straight Leg Heel Taps - 2 x daily - 6 x weekly - 3 sets - 10 reps Sit to Stand Without Arm Support - 2 x daily - 6 x weekly - 2 sets - 10 reps Figure 4 Bridge - 2 x daily - 6 x weekly - 1 sets - 20 reps - 5 sec hold

## 2020-11-21 NOTE — Therapy (Signed)
[facility] [address] [place], [place], [address] Phone: [phone]   Fax:  [phone]  Physical Therapy Evaluation  Patient Details  Name: Natalie Montgomery MRN: [id number] Date of Birth: 12/13/1985 Referring Provider (PT): Teressa Lower   Encounter Date: 11/21/2020   PT End of Session - 11/21/20 9528     Visit Number 1    Number of Visits 6    Date for PT Re-Evaluation 01/02/21    PT Start Time 0845    PT Stop Time 0930    PT Time Calculation (min) 45 min    Activity Tolerance Patient tolerated treatment well    Behavior During Therapy [facility] for tasks assessed/performed             Past Medical History:  Diagnosis Date   Back pain    History of chicken pox    History of kidney stones     History reviewed. No pertinent surgical history.  There were no vitals filed for this visit.    Subjective Assessment - 11/21/20 0852     Subjective She injured knee twisting in December 2021, she had injection which helped and then the pain came back 6 months ago. She had another injection and was referred to PT.    Pertinent History no significant PMH    Diagnostic tests Ultrasound evaluation prior to injection reveals no obvious large lateral parameniscal cysts.  Narrowed medial joint line.  No Baker's cyst.    Currently in Pain? Yes    Pain Score 8    at worst, no pain sitting   Pain Location Knee    Pain Orientation Right    Pain Descriptors / Indicators Aching   "feels like gravel in there"   Pain Type Other (Comment)   subacute   Pain Radiating Towards denies N/T other than one time    Pain Onset More than a month ago    Pain Frequency Intermittent    Aggravating Factors  5-30 min of standing, stairs, squatting    Pain Relieving Factors laying down or sitting, having her knee flexed, rocksauce, compression sleeve.                [facility] PT Assessment - 11/21/20 0001       Assessment   Medical Diagnosis Rt knee pain     Referring Provider (PT) Denyse Amass, E    Onset Date/Surgical Date 12/17/20    Prior Therapy none      Precautions   Precautions None      Balance Screen   Has the patient fallen in the past 6 months No    Has the patient had a decrease in activity level because of a fear of falling?  No    Is the patient reluctant to leave their home because of a fear of falling?  No      Home Tourist information centre manager residence      Prior Function   Vocation Full time employment    Herbalist    Leisure walking, build leggos      Cognition   Overall Cognitive Status Within Functional Limits for tasks assessed      Observation/Other Assessments   Focus on Therapeutic Outcomes (FOTO)  46% functional      ROM / Strength   AROM / PROM / Strength AROM;Strength      AROM   Overall AROM Comments Rt knee 0-130, She does have tight ankle DF limited to neutral  Strength   Strength Assessment Site Hip;Knee    Right/Left Hip Right;Left    Right Hip Flexion 5/5    Right Hip ABduction 4+/5    Left Hip Flexion 5/5    Left Hip ABduction 5/5    Right/Left Knee Right;Left    Right Knee Flexion 4/5    Right Knee Extension 4/5    Left Knee Flexion 5/5    Left Knee Extension 5/5      Special Tests   Other special tests negative valgus and varus and anterior drawer stress testing, + mcmurrays test      Ambulation/Gait   Gait Comments independent community ambulator but antalgic on Rt at times and excessive toeing out likely due to limited ankle DF                        Objective measurements completed on examination: See above findings.       [facility] Adult PT Treatment/Exercise - 11/21/20 0001       Modalities   Modalities Electrical Stimulation      Electrical Stimulation   Electrical Stimulation Location Rt knee    Electrical Stimulation Action IFC    Electrical Stimulation Parameters to tolerance for 15 min    Electrical Stimulation  Goals Pain                     PT Education - 11/21/20 0937     Education Details HEP,POC,TENS    Person(s) Educated Patient    Methods Explanation;Demonstration;Verbal cues;Handout    Comprehension Verbalized understanding;Returned demonstration                 PT Long Term Goals - 11/21/20 0952       PT LONG TERM GOAL #1   Title Pt will improve FOTO to 63% functional from 46%    Time 6    Period Weeks    Status New    Target Date 01/02/21      PT LONG TERM GOAL #2   Title Pt will improve Rt knee strength to 5/5 MMT to improve funcitonal strength    Time 6    Period Weeks    Status New      PT LONG TERM GOAL #3   Title Pt will report less than 3/10 overall pain with standing and walking and stairs.    Time 6    Period Weeks    Status New      PT LONG TERM GOAL #4   Title Pt will be I and complliant with HEP    Baseline 6    Period Weeks    Status New                    Plan - 11/21/20 0940     Clinical Impression Statement Pt presents with subacute Rt knee pain. MD impression is degenerative meniscus tear versus medial compartment DJD. I also suspect medial meniscus based on signs and symptoms. She has excessive Toe out during gait which may lead to increased strain to medial knee. 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 She will benefit from skilled PT to address her functional impairments listed below with proposed interventions listed below.    Examination-Activity Limitations Lift;Stand;Stairs;Squat;Locomotion Level    Examination-Participation Restrictions Cleaning;Community Activity;Driving;Laundry;Yard Work;Shop;Occupation    Stability/Clinical Decision Making Stable/Uncomplicated    Clinical Decision Making Low    Rehab  Potential Good    PT Frequency 1x / week    PT Duration 6 weeks    PT Treatment/Interventions Cryotherapy;Electrical Stimulation;Iontophoresis 4mg /ml  Dexamethasone;Ultrasound;Moist Heat;Gait training;Stair training;Therapeutic activities;Therapeutic exercise;Neuromuscular re-education;Manual techniques;Passive range of motion;Dry needling;Joint Manipulations;Vasopneumatic Device;Taping    PT Next Visit Plan quad strength, ankle DF ROM, modalaties for pain PRN    PT Home Exercise Plan Access Code:    Consulted and Agree with Plan of Care Patient             Patient will benefit from skilled therapeutic intervention in order to improve the following deficits and impairments:  Decreased activity tolerance, Abnormal gait, Decreased range of motion, Decreased strength, Difficulty walking, Pain  Visit Diagnosis: Right knee pain, unspecified chronicity  Muscle weakness (generalized)  Difficulty in walking, not elsewhere classified     Problem List Patient Active Problem List   Diagnosis Date Noted   Carpal tunnel syndrome, right upper limb 11/03/2019   Carpal tunnel syndrome on left 11/03/2019   Right knee pain 02/15/2019   Adverse food reaction 10/06/2018   Other allergic rhinitis 10/06/2018   Chest pain 02/03/2017   Costal margin pain 02/03/2017   Back pain 02/03/2017   Anxiety 02/03/2017   Dizziness 02/03/2017   Sacroiliac inflammation (H[facility]) 11/05/2015    11/07/2015, PT,DPT 11/21/2020, 9:59 AM  [facility] Therapy [address] [place], [place], [place] Phone: [phone]   Fax:  [phone]  Name: Danali Marinos MRN: Rick Duff Date of Birth: Jan 01, 1986

## 2020-11-30 ENCOUNTER — Other Ambulatory Visit: Payer: Self-pay

## 2020-11-30 ENCOUNTER — Encounter: Payer: Self-pay | Admitting: Rehabilitative and Restorative Service Providers"

## 2020-11-30 ENCOUNTER — Ambulatory Visit: Payer: BC Managed Care – PPO | Admitting: Rehabilitative and Restorative Service Providers"

## 2020-11-30 DIAGNOSIS — M25561 Pain in right knee: Secondary | ICD-10-CM | POA: Diagnosis not present

## 2020-11-30 DIAGNOSIS — M6281 Muscle weakness (generalized): Secondary | ICD-10-CM

## 2020-11-30 DIAGNOSIS — R262 Difficulty in walking, not elsewhere classified: Secondary | ICD-10-CM | POA: Diagnosis not present

## 2020-11-30 NOTE — Patient Instructions (Signed)
Focus on seated straight leg raises and upper heel cords stretch with HEP

## 2020-11-30 NOTE — Therapy (Addendum)
[facility] Therapy [address] [place], [place], [address] Phone: [phone]   Fax:  [phone]  Physical Therapy Treatment/Discharge  Patient Details  Name: Natalie Montgomery MRN: [id number] Date of Birth: 08/05/1985 Referring Provider (PT): Georgina Snell, E  PHYSICAL THERAPY DISCHARGE SUMMARY  Visits from Start of Care: 2  Current functional level related to goals / functional outcomes: See note   Remaining deficits: See note   Education / Equipment: HEP   Patient agrees to discharge. Patient goals were partially met. Patient is being discharged due to not returning since the last visit.   Encounter Date: 11/30/2020   PT End of Session - 11/30/20 1740     Visit Number 2    Number of Visits 6    Date for PT Re-Evaluation 01/02/21    PT Start Time 0931    PT Stop Time 1015    PT Time Calculation (min) 44 min    Activity Tolerance Patient tolerated treatment well;No increased pain    Behavior During Therapy WFL for tasks assessed/performed             Past Medical History:  Diagnosis Date   Back pain    History of chicken pox    History of kidney stones     History reviewed. No pertinent surgical history.  There were no vitals filed for this visit.   Subjective Assessment - 11/30/20 0939     Subjective Natalie Montgomery reports good early HEP compliance, although she had difficulty with some of the exercises.    Pertinent History no significant PMH    How long can you sit comfortably? Sit to stand is tough along with stairs, sewing (pedal) and driving    How long can you stand comfortably? 20 minutes    How long can you walk comfortably? 1-1.25 miles is painful    Diagnostic tests Ultrasound evaluation prior to injection reveals no obvious large lateral parameniscal cysts.  Narrowed medial joint line.  No Baker's cyst.    Currently in Pain? 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, product/process development)    Multiple Pain Sites No                               [facility] Adult PT Treatment/Exercise - 11/30/20 0001       Exercises   Exercises Knee/Hip      Knee/Hip Exercises: Aerobic   Recumbent Bike Seat 5 for 10 minutes Level 3-4      Knee/Hip Exercises: Machines for Strengthening   Cybex Knee Extension 90-30 degrees with slow eccentrics 15X slow eccentrics 20#      Knee/Hip Exercises: Standing   Other Standing Knee Exercises Upper heel cords stretch 5X 20 seconds (slight toe in with wide stance)    Other Standing Knee Exercises Attempted lower heel cords stretch but stopped due to knee pain      Knee/Hip Exercises: Seated  Other Seated Knee/Hip Exercises Seated straight leg raises (toes back, press town and tighten quadriceps, hold 2 seconds, lift leg 6-8 inches, hold 3 seconds then slowly lower while maintaining a good quadriceps contraction) 3 sets of 5                     PT Education - 11/30/20 1740     Education Details Reviewed HEP with feedback and correction provided.    Person(s) Educated Patient    Methods Explanation;Demonstration;Tactile cues;Verbal cues;Handout    Comprehension Verbalized understanding;Tactile cues required;Need further instruction;Returned demonstration;Verbal cues required                 PT Long Term Goals - 11/21/20 0952       PT LONG TERM GOAL #1   Title Pt will improve FOTO to 63% functional from 46%    Time 6    Period Weeks    Status New    Target Date 01/02/21      PT LONG TERM GOAL #2   Title Pt will improve Rt knee strength to 5/5 MMT to improve  funcitonal strength    Time 6    Period Weeks    Status New      PT LONG TERM GOAL #3   Title Pt will report less than 3/10 overall pain with standing and walking and stairs.    Time 6    Period Weeks    Status New      PT LONG TERM GOAL #4   Title Pt will be I and complliant with HEP    Baseline 6    Period Weeks    Status New                   Plan - 11/30/20 1742     Clinical Impression Statement Natalie Montgomery did a good job with her early HEP.  She had some difficulty and technique was corrected.  Some activities were more painful (soleus stretch with bent knee and sit to stand) so I recommended she focus on the other activities.  Soleus stretch and sit to stand will be modified or re-introduced later as quadriceps strength improves and knee pain decreases.    Examination-Activity Limitations Lift;Stand;Stairs;Squat;Locomotion Level    Examination-Participation Restrictions Cleaning;Community Activity;Driving;Laundry;Yard Work;Shop;Occupation    Stability/Clinical Decision Making Stable/Uncomplicated    Rehab Potential Good    PT Frequency 1x / week    PT Duration 6 weeks    PT Treatment/Interventions Cryotherapy;Electrical Stimulation;Iontophoresis 4m/ml Dexamethasone;Ultrasound;Moist Heat;Gait training;Stair training;Therapeutic activities;Therapeutic exercise;Neuromuscular re-education;Manual techniques;Passive range of motion;Dry needling;Joint Manipulations;Vasopneumatic Device;Taping    PT Next Visit Plan Quadriceps strength progression ([id number] without pain), proprioception and balance    PT Home Exercise Plan Access Code: [email or web address]   Consulted and Agree with Plan of Care Patient             Patient will benefit from skilled therapeutic intervention in order to improve the following deficits and impairments:  Decreased activity tolerance, Abnormal gait, Decreased range of motion, Decreased strength, Difficulty walking, Pain  Visit Diagnosis: Difficulty  in walking, not elsewhere classified  Muscle weakness (generalized)  Right knee pain, unspecified chronicity     Problem List Patient Active Problem List   Diagnosis Date Noted   Carpal tunnel syndrome, right upper limb 11/03/2019   Carpal tunnel syndrome on left 11/03/2019   Right knee pain 02/15/2019   Adverse food reaction 10/06/2018   Other allergic rhinitis 10/06/2018   Chest pain  02/03/2017   Costal margin pain 02/03/2017   Back pain 02/03/2017   Anxiety 02/03/2017   Dizziness 02/03/2017   Sacroiliac inflammation ([place]) 11/05/2015    Farley Ly, PT, MPT 11/30/2020, 5:44 PM  [facility] [address] [place], [place], [address] Phone: [phone]   Fax:  [phone]  Name: Natalie Montgomery MRN: [id number] Date of Birth: 02-29-1985

## 2020-12-05 ENCOUNTER — Encounter: Payer: BC Managed Care – PPO | Admitting: Physical Therapy

## 2020-12-12 ENCOUNTER — Encounter: Payer: BC Managed Care – PPO | Admitting: Physical Therapy

## 2020-12-13 NOTE — Progress Notes (Deleted)
   I, [name], LAT, ATC, am serving as scribe for Dr. Clementeen Graham.  Natalie Montgomery is a 35 y.o. female who presents to [facility] at [facility] today for f/u of R medial knee pain that initially began on 02/07/19 when she twisted her knee and then flared up approximately 6-7 months ago.  She was last seen by Dr. Denyse Amass on 11/05/20 and was referred to PT of which she's completed 2 sessions.  She also had a R knee steroid injection at her last visit.  Today, pt reports   Diagnostic testing: R knee XR- 11/05/20  Pertinent review of systems: ***  Relevant historical information: ***   Exam:  There were no vitals taken for this visit. General: Well Developed, well nourished, and in no acute distress.   MSK: ***    Lab and Radiology Results No results found for this or any previous visit (from the past 72 hour(s)). No results found.     Assessment and Plan: 34 y.o. female with ***   PDMP not reviewed this encounter. No orders of the defined types were placed in this encounter.  No orders of the defined types were placed in this encounter.    Discussed warning signs or symptoms. Please see discharge instructions. Patient expresses understanding.   ***

## 2020-12-17 ENCOUNTER — Ambulatory Visit: Payer: BC Managed Care – PPO | Admitting: Family Medicine

## 2020-12-19 NOTE — Progress Notes (Signed)
   I, Natalie Montgomery, LAT, ATC, am serving as scribe for Dr. Clementeen Graham.  Natalie Montgomery is a 35 y.o. female who presents to [facility] at [facility] today for f/u of recurrent R knee pain.  She was last seen by Dr. Denyse Montgomery on 11/05/20 and had a R knee steroid injection.  She was also referred to PT of which she's had 2 sessions.  Today, pt reports no relief from steroid injection. Pt notes some benefit from PT. Pt is still having R knee pain depending on her activity.  Diagnostic testing: R knee XR- 11/05/20  Pertinent review of systems: No fevers or chills  Relevant historical information: SI inflammation.  Carpal tunnel syndrome.   Exam:  BP 128/84   Pulse 100   Ht 5\' 8"  (1.727 m)   Wt 218 lb (98.9 kg)   SpO2 99%   BMI 33.15 kg/m  General: Well Developed, well nourished, and in no acute distress.   MSK: Right knee normal motion with crepitation.  Normal gait    Lab and Radiology Results  EXAM: RIGHT KNEE 3 VIEWS   COMPARISON:  None.   FINDINGS: No evidence of fracture, dislocation, or joint effusion. Normal alignment. Normal joint spaces. Patella normally situated in the trochlear groove no evidence of arthropathy or other focal bone abnormality. Soft tissues are unremarkable.   IMPRESSION: Unremarkable radiographs of the right knee.     Electronically Signed   By: M.D.   On: 11/05/2020 11:35 I, 11/07/2020, personally (independently) visualized and performed the interpretation of the images attached in this note.      Assessment and Plan: 35 y.o. female with right knee pain.  Not much improvement with steroid injection but some improvement with early physical therapy.  We will give physical therapy 1 month trial longer to improve.  If not better in a month neck step should be MRI to further characterize cause of pain and for potential injection or surgical planning.  Next injection step could be hyaluronic acid  injections..  Patient is working on weight loss as well which will help.   Discussed warning signs or symptoms. Please see discharge instructions. Patient expresses understanding.   The above documentation has been reviewed and is accurate and complete 31, M.D.

## 2020-12-20 ENCOUNTER — Other Ambulatory Visit: Payer: Self-pay

## 2020-12-20 ENCOUNTER — Ambulatory Visit (INDEPENDENT_AMBULATORY_CARE_PROVIDER_SITE_OTHER): Payer: BC Managed Care – PPO | Admitting: Family Medicine

## 2020-12-20 VITALS — BP 128/84 | HR 100 | Ht 68.0 in | Wt 218.0 lb

## 2020-12-20 DIAGNOSIS — M25561 Pain in right knee: Secondary | ICD-10-CM | POA: Diagnosis not present

## 2020-12-20 NOTE — Patient Instructions (Addendum)
Thank you for coming in today.   Send me a message in 1 month if not improved.   I will order a MRI if needed.

## 2020-12-28 ENCOUNTER — Encounter: Payer: BC Managed Care – PPO | Admitting: Rehabilitative and Restorative Service Providers"

## 2021-02-12 IMAGING — MG DIGITAL DIAGNOSTIC BILAT W/ TOMO W/ CAD
8 series · 8 of 24 positions shown · non-contrast
Comparison: None.

CLINICAL DATA: Patient's physician palpated an abnormality in the 7
o'clock region of the right breast.

EXAM:
DIGITAL DIAGNOSTIC BILATERAL MAMMOGRAM WITH CAD AND TOMO
ULTRASOUND RIGHT BREAST

[R MLO synth-2D]
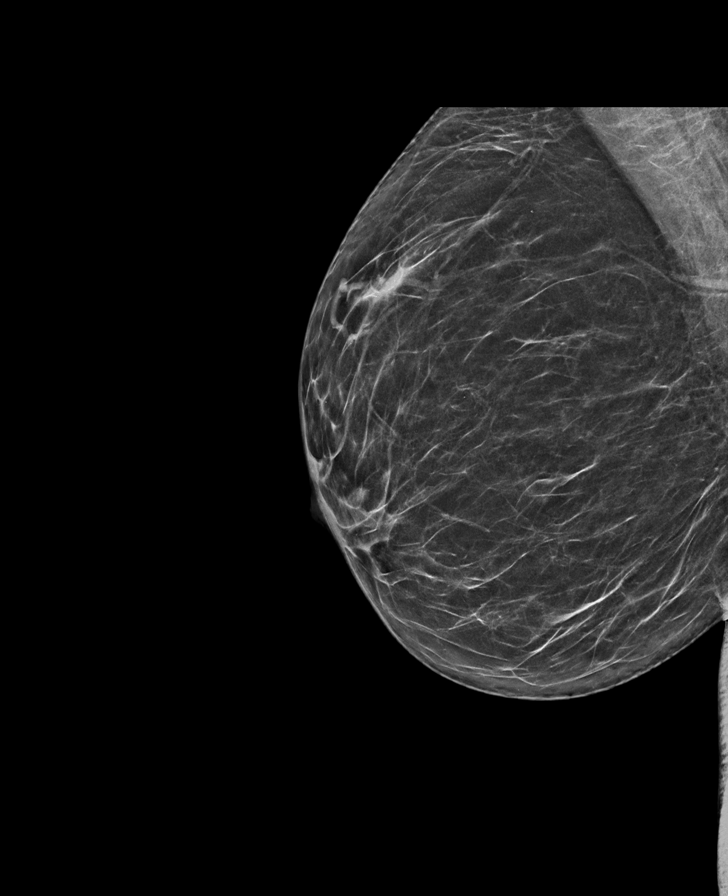

[R CC synth-2D]
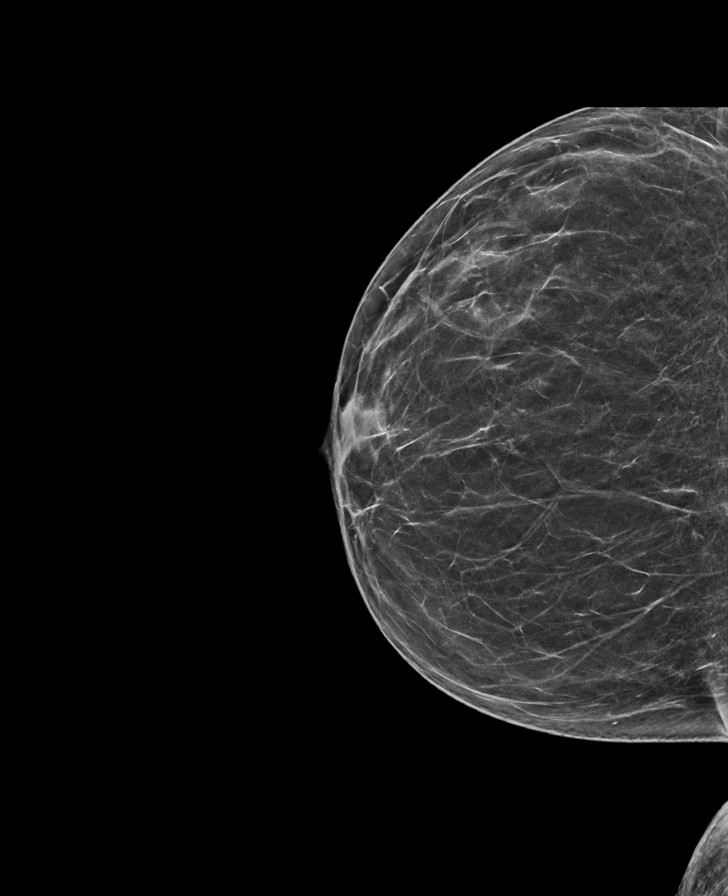

[L MLO synth-2D]
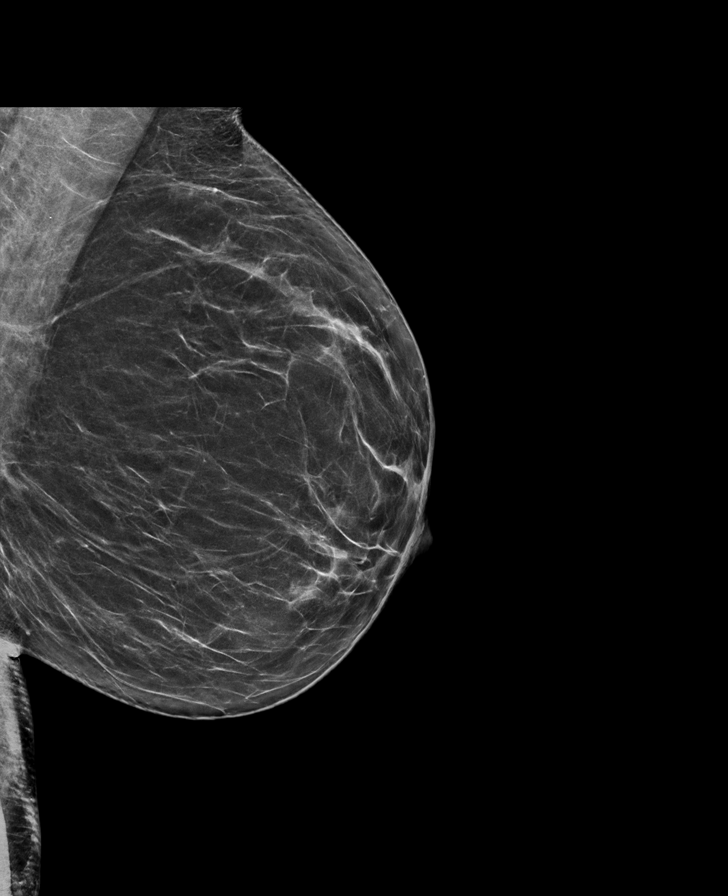

[L CC synth-2D]
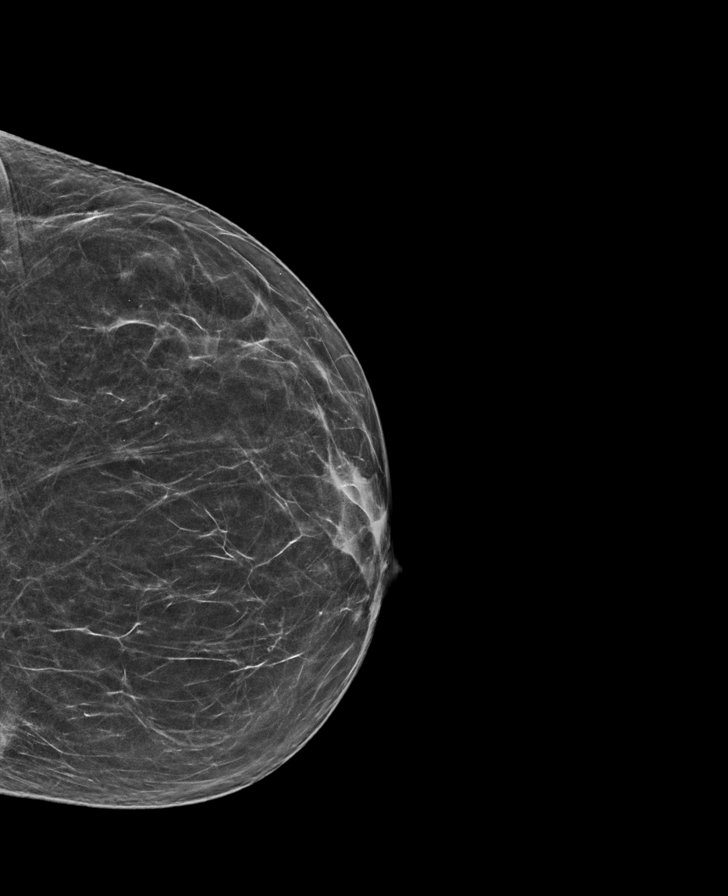

[R CC tomo · tomo slice 31/60.0]
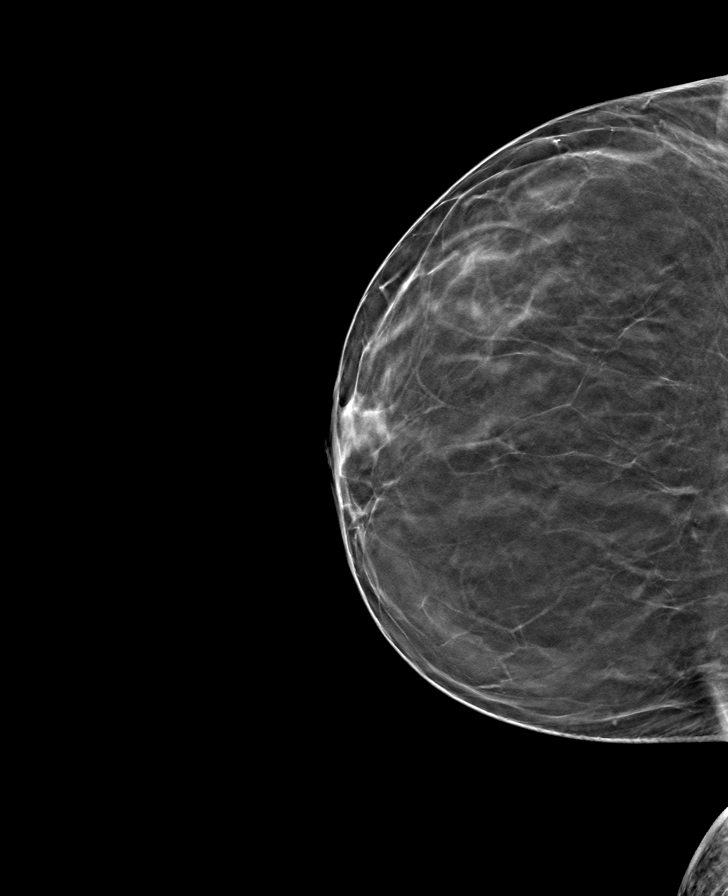

[L CC tomo · tomo slice 31/60.0]
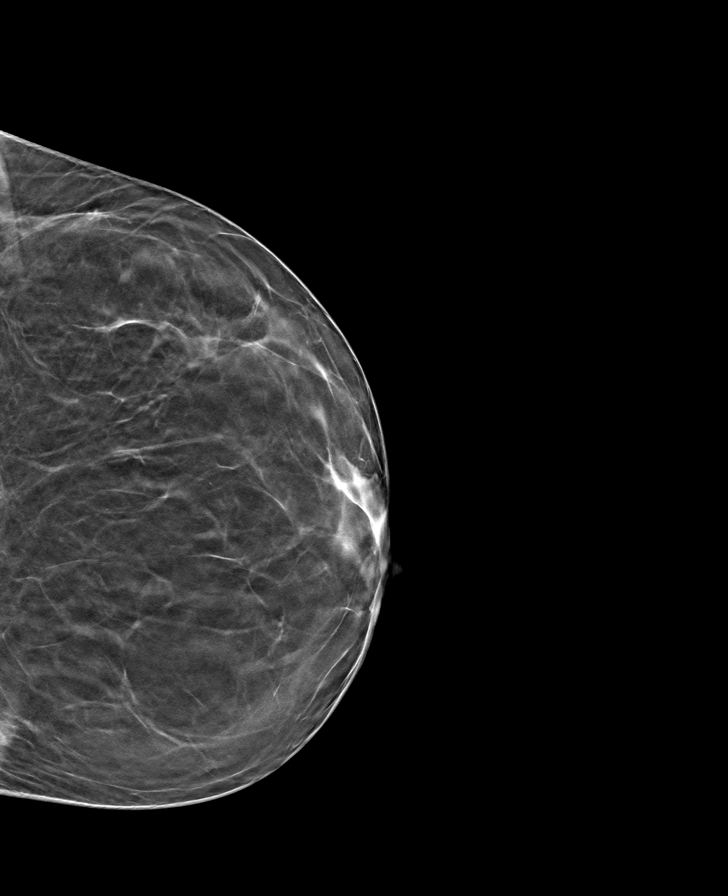

[R MLO tomo · tomo slice 33/66.0]
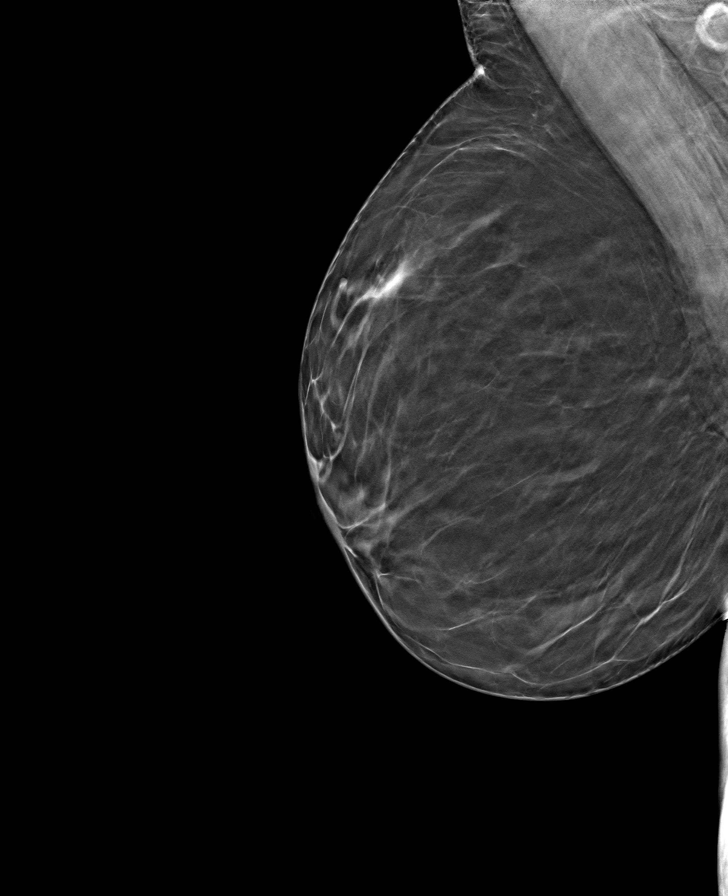

[L MLO tomo · tomo slice 35/68.0]
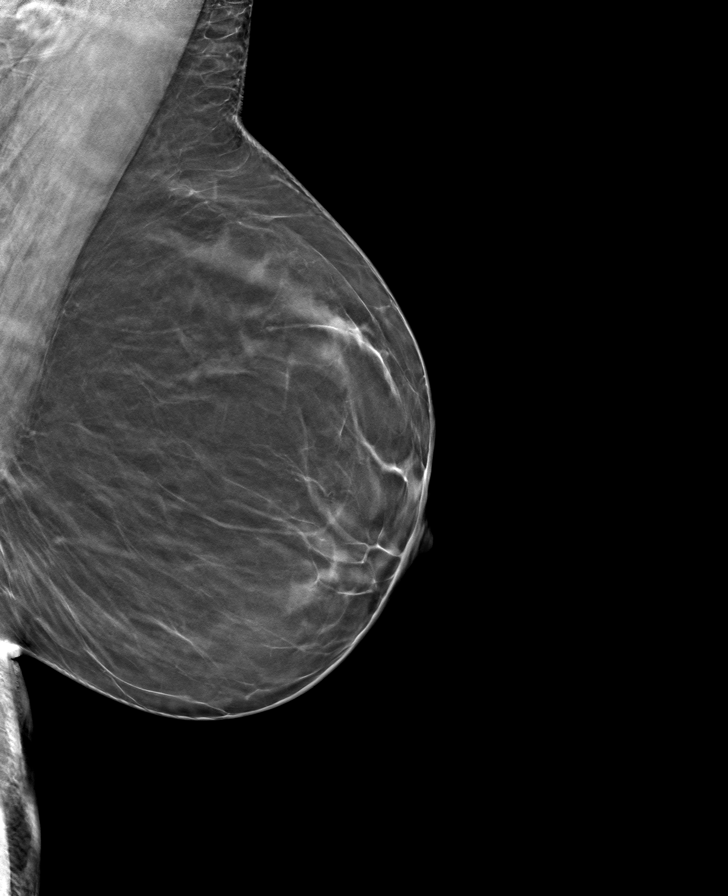

[8 of 24 positions shown; findings below may reference images not displayed]

ACR Breast Density Category b: There are scattered areas of
fibroglandular density.
FINDINGS: No suspicious mass, malignant type microcalcifications or distortion
detected in either breast.

Mammographic images were processed with CAD.

Targeted ultrasound is performed, showing normal tissue in the area
of clinical concern in the right breast from 7-9 o'clock.
IMPRESSION: No evidence of malignancy in either breast.

RECOMMENDATION:
If the clinical exam remains benign/stable screening mammography can
be deferred until the age of 40.

I have discussed the findings and recommendations with the patient.
If applicable, a reminder letter will be sent to the patient
regarding the next appointment.

BI-RADS CATEGORY  1: Negative.

## 2021-03-08 ENCOUNTER — Encounter: Payer: Self-pay | Admitting: Family Medicine

## 2021-03-08 ENCOUNTER — Telehealth (INDEPENDENT_AMBULATORY_CARE_PROVIDER_SITE_OTHER): Payer: BC Managed Care – PPO | Admitting: Family Medicine

## 2021-03-08 DIAGNOSIS — J069 Acute upper respiratory infection, unspecified: Secondary | ICD-10-CM | POA: Diagnosis not present

## 2021-03-08 MED ORDER — BENZONATATE 200 MG PO CAPS: 200.0000 mg | ORAL_CAPSULE | Freq: Two times a day (BID) | ORAL | 0 refills | Status: DC | PRN

## 2021-03-08 MED ORDER — GUAIFENESIN ER 600 MG PO TB12: 1200.0000 mg | ORAL_TABLET | Freq: Two times a day (BID) | ORAL | 2 refills | Status: DC

## 2021-03-08 MED ORDER — HYDROCOD POLI-CHLORPHE POLI ER 10-8 MG/5ML PO SUER: 5.0000 mL | Freq: Two times a day (BID) | ORAL | 0 refills | Status: AC | PRN

## 2021-03-08 NOTE — Progress Notes (Signed)
Virtual Video Visit via MyChart Note  I connected with  Natalie Montgomery on 03/08/21 at  9:40 AM EST by the video enabled telemedicine application for MyChart, and verified that I am speaking with the correct person using two identifiers.   I introduced myself as a Publishing rights manager with the practice. We discussed the limitations of evaluation and management by telemedicine and the availability of in person appointments. The patient expressed understanding and agreed to proceed.  Participating parties in this visit include: The patient and the nurse practitioner listed.  The patient is: At home I am: In the office - [facility]  Subjective:    CC:  Chief Complaint  Patient presents with   Cough   Sore Throat    Headache Stuffy nose No fever    HPI: Natalie Montgomery is a 36 y.o. year old female presenting today via MyChart today for URI symptoms.  Patient reports 3 days ago, Tuesday night, she started to develop a mildly sore throat.  By the next morning when she woke up the sore throat was quite painful and she was also experiencing rhinorrhea, nasal congestion, productive cough, sinus pressure, ear fullness/pressure, fatigue, mild body aches.  She has not had any chest pain, dyspnea, fever, chills, nausea, vomiting, diarrhea, loss of taste or smell.  Her husband had similar symptoms but more mild last week and he is already feeling better.  She had a negative COVID test today.  So far she has gotten minimal improvement with NyQuil, ibuprofen, Flonase.      Past medical history, Surgical history, Family history not pertinant except as noted below, Social history, Allergies, and medications have been entered into the medical record, reviewed, and corrections made.   Review of Systems:  All review of systems negative except what is listed in the HPI   Objective:    General:  Speaking clearly in complete sentences. Absent shortness  of breath noted.   Alert and oriented x3.   Normal judgment.  Absent acute distress.   Impression and Recommendations:    1. Viral URI with cough - guaiFENesin (MUCINEX) 600 MG 12 hr tablet; Take 2 tablets (1,200 mg total) by mouth 2 (two) times daily.  Dispense: 30 tablet; Refill: 2 - benzonatate (TESSALON) 200 MG capsule; Take 1 capsule (200 mg total) by mouth 2 (two) times daily as needed for cough.  Dispense: 20 capsule; Refill: 0 - chlorpheniramine-HYDROcodone 10-8 MG/5ML; Take 5 mLs by mouth every 12 (twelve) hours as needed for up to 5 days for cough (cough, will cause drowsiness.).  Dispense: 50 mL; Refill: 0   Recommend starting with a few more days of conservative management.  Sending in Mucinex, Tessalon, Tussionex for nighttime cough.  PDMP reviewed. Continue supportive measures including rest, hydration, humidifier use, steam showers, warm compresses to sinuses, warm liquids with lemon and honey, and over-the-counter cough, cold, and analgesics as needed. Patient aware of signs/symptoms requiring further/urgent evaluation.   Follow-up if symptoms worsen or fail to improve.    I discussed the assessment and treatment plan with the patient. The patient was provided an opportunity to ask questions and all were answered. The patient agreed with the plan and demonstrated an understanding of the instructions.   The patient was advised to call back or seek an in-person evaluation if the symptoms worsen or if the condition fails to improve as anticipated.  I spent 20 minutes dedicated to the care of this patient on the  date of this encounter to include pre-visit chart review of prior notes and results, face-to-face time with the patient, and post-visit ordering of testing as indicated.   Clayborne Dana, NP

## 2021-10-31 ENCOUNTER — Ambulatory Visit: Payer: BC Managed Care – PPO

## 2021-11-01 ENCOUNTER — Encounter (HOSPITAL_COMMUNITY): Payer: Self-pay

## 2021-11-01 ENCOUNTER — Ambulatory Visit (HOSPITAL_COMMUNITY)
Admission: RE | Admit: 2021-11-01 | Discharge: 2021-11-01 | Disposition: A | Discharge status: Home / Self Care | Payer: BC Managed Care – PPO | Source: Ambulatory Visit | Attending: Family Medicine | Admitting: Family Medicine

## 2021-11-01 VITALS — BP 126/85 | HR 81 | Temp 98.2°F | Resp 16

## 2021-11-01 DIAGNOSIS — H6690 Otitis media, unspecified, unspecified ear: Secondary | ICD-10-CM | POA: Diagnosis not present

## 2021-11-01 DIAGNOSIS — J069 Acute upper respiratory infection, unspecified: Secondary | ICD-10-CM | POA: Diagnosis not present

## 2021-11-01 DIAGNOSIS — Z20822 Contact with and (suspected) exposure to covid-19: Secondary | ICD-10-CM | POA: Diagnosis not present

## 2021-11-01 LAB — SARS CORONAVIRUS 2 BY RT PCR: SARS Coronavirus 2 by RT PCR: NEGATIVE

## 2021-11-01 MED ORDER — AMOXICILLIN 875 MG PO TABS: 875.0000 mg | ORAL_TABLET | Freq: Two times a day (BID) | ORAL | 0 refills | Status: DC

## 2021-11-01 MED ORDER — IBUPROFEN 800 MG PO TABS: 800.0000 mg | ORAL_TABLET | Freq: Three times a day (TID) | ORAL | 0 refills | Status: DC | PRN

## 2021-11-01 NOTE — ED Provider Notes (Signed)
[facility]    CSN: [id number] Arrival date & time: 11/01/21  2725      History   Chief Complaint Chief Complaint  Patient presents with   Ear Drainage    I've been having a light ear pain all week. They have been "gooey". Today the pain got worse and spiked a fever. I have a little bit of a stuffed nose and scratchy throat. But the ears are the worst. - Entered by patient    HPI Natalie Montgomery is a 36 y.o. female.    Ear Drainage   Here with bilateral ear pain and mild nasal congestion since September 11.  Her ears have felt gooey and then have drained a little more in the last day, then last night she had fever and increased pain in her right ear.  Maybe earlier in the week she had a little bit of malaise and aches and chills.  She is unaware of any fever.  No nausea, vomiting, or diarrhea.  Periods are regular and she has an IUD.  Past Medical History:  Diagnosis Date   Back pain    History of chicken pox    History of kidney stones     Patient Active Problem List   Diagnosis Date Noted   Carpal tunnel syndrome, right upper limb 11/03/2019   Carpal tunnel syndrome on left 11/03/2019   Right knee pain 02/15/2019   Adverse food reaction 10/06/2018   Other allergic rhinitis 10/06/2018   Chest pain 02/03/2017   Costal margin pain 02/03/2017   Back pain 02/03/2017   Anxiety 02/03/2017   Dizziness 02/03/2017   Sacroiliac inflammation ([place]) 11/05/2015    History reviewed. No pertinent surgical history.  OB History   No obstetric history on file.      Home Medications    Prior to Admission medications   Medication Sig Start Date End Date Taking? Authorizing Provider  amoxicillin (AMOXIL) 875 MG tablet Take 1 tablet (875 mg total) by mouth 2 (two) times daily for 7 days. 11/01/21 11/08/21 Yes Barrett Henle, MD  ibuprofen (ADVIL) 800 MG tablet Take 1 tablet (800 mg total) by mouth every 8 (eight) hours as needed (pain). 11/01/21  Yes  Barrett Henle, MD  ALPRAZolam Duanne Moron) 0.25 MG tablet 1-2 tablet po bid prn anxiety 07/26/20   Marrian Salvage, FNP  fluticasone [facility]) 50 MCG/ACT nasal spray Place 2 sprays into both nostrils daily. 12/03/18   Marrian Salvage, FNP  guaiFENesin (MUCINEX) 600 MG 12 hr tablet Take 2 tablets (1,200 mg total) by mouth 2 (two) times daily. 03/08/21   Terrilyn Saver, NP  levonorgestrel (KYLEENA) 19.5 MG IUD Kyleena 17.5 mcg/24 hrs (48yrs) 19.$RemoveBefore'5mg'lpyKOMpmPLVkm$  intrauterine device  Take 1 device by intrauterine route.    [provider]    Family History Family History  Problem Relation Age of Onset   Hypertension Mother    Diabetes Mother    Hypertension Father    Pulmonary embolism Sister    Multiple sclerosis Maternal Grandmother    Heart disease Maternal Grandfather     Social History Social History   Tobacco Use   Smoking status: Never   Smokeless tobacco: Never  Vaping Use   Vaping Use: Never used  Substance Use Topics   Alcohol use: Yes    Alcohol/week: 4.0 standard drinks of alcohol    Types: 1 Glasses of wine, 1 Cans of beer, 1 Shots of liquor, 1 Standard drinks or equivalent per week  Drug use: No     Allergies   Shellfish allergy and Pecan nut (diagnostic)   Review of Systems Review of Systems   Physical Exam Triage Vital Signs ED Triage Vitals  Enc Vitals Group     BP 11/01/21 0934 126/85     Pulse Rate 11/01/21 0934 81     Resp 11/01/21 0934 16     Temp 11/01/21 0934 98.2 F (36.8 C)     Temp Source 11/01/21 0934 Oral     SpO2 11/01/21 0934 99 %     Weight --      Height --      Head Circumference --      Peak Flow --      Pain Score 11/01/21 0935 5     Pain Loc --      Pain Edu? --      Excl. in [place]? --    No data found.  Updated Vital Signs BP 126/85 (BP Location: Left Arm)   Pulse 81   Temp 98.2 F (36.8 C) (Oral)   Resp 16   SpO2 99%   Visual Acuity Right Eye Distance:   Left Eye Distance:   Bilateral Distance:     Right Eye Near:   Left Eye Near:    Bilateral Near:     Physical Exam Vitals reviewed.  Constitutional:      General: She is not in acute distress.    Appearance: She is not toxic-appearing.  HENT:     Ears:     Comments: TMs are bilaterally dull.  The right one is slightly pink.  There is no swelling of the canal and no discharge noted in the canal on either side.    Nose: Congestion present.     Mouth/Throat:     Mouth: Mucous membranes are moist.     Comments: No erythema but there is some clear mucus draining Eyes:     Extraocular Movements: Extraocular movements intact.     Conjunctiva/sclera: Conjunctivae normal.     Pupils: Pupils are equal, round, and reactive to light.  Cardiovascular:     Rate and Rhythm: Normal rate and regular rhythm.     Heart sounds: No murmur heard. Pulmonary:     Effort: Pulmonary effort is normal. No respiratory distress.     Breath sounds: No wheezing, rhonchi or rales.  Chest:     Chest wall: No tenderness.  Musculoskeletal:     Cervical back: Neck supple.  Lymphadenopathy:     Cervical: No cervical adenopathy.  Skin:    Capillary Refill: Capillary refill takes less than 2 seconds.     Coloration: Skin is not jaundiced or pale.  Neurological:     General: No focal deficit present.     Mental Status: She is alert and oriented to person, place, and time.  Psychiatric:        Behavior: Behavior normal.      UC Treatments / Results  Labs (all labs ordered are listed, but only abnormal results are displayed) Labs Reviewed  SARS CORONAVIRUS 2 BY RT PCR    EKG   Radiology No results found.  Procedures Procedures (including critical care time)  Medications Ordered in UC Medications - No data to display  Initial Impression / Assessment and Plan / UC Course  I have reviewed the triage vital signs and the nursing notes.  Pertinent labs & imaging results that were available during my care of the patient were reviewed by me  and considered in my medical decision making (see chart for details).        I am going to treat for acute otitis media with amoxicillin.  She did do a home COVID test yesterday that was negative, but since she has a wedding to go, to we decided to do a PCR COVID test.  Staff will notify her if it is positive.  She also has MyChart to see the result.    Last EGFR was 119 in epic.  If she is positive for COVID she should have a prescription for Paxlovid Final Clinical Impressions(s) / UC Diagnoses   Final diagnoses:  Acute otitis media, unspecified otitis media type  Viral URI     Discharge Instructions      Take amoxicillin 875 mg--1 tab twice daily for 7 days  Take ibuprofen 800 mg--1 tab every 8 hours as needed for pain.   You have been swabbed for COVID, and the test will result in the next 24 hours. Our staff will call you if positive. If the test is positive, you should quarantine for 5 days from the start of your symptoms      ED Prescriptions     Medication Sig Dispense Auth. Provider   amoxicillin (AMOXIL) 875 MG tablet Take 1 tablet (875 mg total) by mouth 2 (two) times daily for 7 days. 14 tablet Maritza Hosterman, Gwenlyn Perking, MD   ibuprofen (ADVIL) 800 MG tablet Take 1 tablet (800 mg total) by mouth every 8 (eight) hours as needed (pain). 21 tablet Makenzee Choudhry, Gwenlyn Perking, MD      PDMP not reviewed this encounter.   Barrett Henle, MD 11/01/21 1023

## 2021-11-01 NOTE — ED Triage Notes (Signed)
Pt states bilateral ear pain and drainage.  Also states she has been having fever,scratchy throat, and stuffy nose for the past 5 days.

## 2021-11-01 NOTE — Discharge Instructions (Addendum)
Take amoxicillin 875 mg--1 tab twice daily for 7 days  Take ibuprofen 800 mg--1 tab every 8 hours as needed for pain.   You have been swabbed for COVID, and the test will result in the next 24 hours. Our staff will call you if positive. If the test is positive, you should quarantine for 5 days from the start of your symptoms

## 2021-11-07 ENCOUNTER — Telehealth: Payer: BC Managed Care – PPO | Admitting: Family Medicine

## 2021-11-07 DIAGNOSIS — R051 Acute cough: Secondary | ICD-10-CM | POA: Diagnosis not present

## 2021-11-07 DIAGNOSIS — J019 Acute sinusitis, unspecified: Secondary | ICD-10-CM

## 2021-11-07 DIAGNOSIS — B9689 Other specified bacterial agents as the cause of diseases classified elsewhere: Secondary | ICD-10-CM

## 2021-11-07 MED ORDER — PROMETHAZINE-DM 6.25-15 MG/5ML PO SYRP: 5.0000 mL | ORAL_SOLUTION | Freq: Four times a day (QID) | ORAL | 0 refills | Status: DC | PRN

## 2021-11-07 MED ORDER — FLUCONAZOLE 150 MG PO TABS: 150.0000 mg | ORAL_TABLET | ORAL | 0 refills | Status: DC

## 2021-11-07 MED ORDER — DOXYCYCLINE HYCLATE 100 MG PO TABS: 100.0000 mg | ORAL_TABLET | Freq: Two times a day (BID) | ORAL | 0 refills | Status: AC

## 2021-11-07 NOTE — Patient Instructions (Addendum)
Lennie Muckle, thank you for joining Perlie Mayo, NP for today's virtual visit.  While this provider is not your primary care provider (PCP), if your PCP is located in our provider database this encounter information will be shared with them immediately following your visit.  Consent: (Patient) Natalie Montgomery provided verbal consent for this virtual visit at the beginning of the encounter.  Current Medications:  Current Outpatient Medications:    doxycycline (VIBRA-TABS) 100 MG tablet, Take 1 tablet (100 mg total) by mouth 2 (two) times daily for 7 days., Disp: 14 tablet, Rfl: 0   fluconazole (DIFLUCAN) 150 MG tablet, Take 1 tablet (150 mg total) by mouth as directed. May repeat in 3-4 days if not improved, Disp: 1 tablet, Rfl: 0   promethazine-dextromethorphan (PROMETHAZINE-DM) 6.25-15 MG/5ML syrup, Take 5 mLs by mouth 4 (four) times daily as needed for cough., Disp: 118 mL, Rfl: 0   ALPRAZolam (XANAX) 0.25 MG tablet, 1-2 tablet po bid prn anxiety, Disp: 40 tablet, Rfl: 0   fluticasone (FLONASE) 50 MCG/ACT nasal spray, Place 2 sprays into both nostrils daily., Disp: 16 g, Rfl: 6   guaiFENesin (MUCINEX) 600 MG 12 hr tablet, Take 2 tablets (1,200 mg total) by mouth 2 (two) times daily., Disp: 30 tablet, Rfl: 2   ibuprofen (ADVIL) 800 MG tablet, Take 1 tablet (800 mg total) by mouth every 8 (eight) hours as needed (pain)., Disp: 21 tablet, Rfl: 0   levonorgestrel (KYLEENA) 19.5 MG IUD, Kyleena 17.5 mcg/24 hrs (27yrs) 19.5mg  intrauterine device  Take 1 device by intrauterine route., Disp: , Rfl:    Medications ordered in this encounter:  Meds ordered this encounter  Medications   doxycycline (VIBRA-TABS) 100 MG tablet    Sig: Take 1 tablet (100 mg total) by mouth 2 (two) times daily for 7 days.    Dispense:  14 tablet    Refill:  0    Order Specific Question:   Supervising Provider    Answer:   Chase Picket [[id number]]   fluconazole (DIFLUCAN) 150 MG tablet    Sig:  Take 1 tablet (150 mg total) by mouth as directed. May repeat in 3-4 days if not improved    Dispense:  1 tablet    Refill:  0    Order Specific Question:   Supervising Provider    Answer:   Chase Picket [[id number]]   promethazine-dextromethorphan (PROMETHAZINE-DM) 6.25-15 MG/5ML syrup    Sig: Take 5 mLs by mouth 4 (four) times daily as needed for cough.    Dispense:  118 mL    Refill:  0    Order Specific Question:   Supervising Provider    Answer:   Chase Picket [[id number]]     *If you need refills on other medications prior to your next appointment, please contact your pharmacy*  Follow-Up: Call back or seek an in-person evaluation if the symptoms worsen or if the condition fails to improve as anticipated.  Other Instructions Sinus Infection, Adult A sinus infection is soreness and swelling (inflammation) of your sinuses. Sinuses are hollow spaces in the bones around your face. They are located: Around your eyes. In the middle of your forehead. Behind your nose. In your cheekbones. Your sinuses and nasal passages are lined with a fluid called mucus. Mucus drains out of your sinuses. Swelling can trap mucus in your sinuses. This lets germs (bacteria, virus, or fungus) grow, which leads to infection. Most of the time, this condition is caused by a  virus. What are the causes? Allergies. Asthma. Germs. Things that block your nose or sinuses. Growths in the nose (nasal polyps). Chemicals or irritants in the air. A fungus. This is rare. What increases the risk? Having a weak body defense system (immune system). Doing a lot of swimming or diving. Using nasal sprays too much. Smoking. What are the signs or symptoms? The main symptoms of this condition are pain and a feeling of pressure around the sinuses. Other symptoms include: Stuffy nose (congestion). This may make it hard to breathe through your nose. Runny nose (drainage). Soreness, swelling, and warmth in the  sinuses. A cough that may get worse at night. Being unable to smell and taste. Mucus that collects in the throat or the back of the nose (postnasal drip). This may cause a sore throat or bad breath. Being very tired (fatigued). A fever. How is this diagnosed? Your symptoms. Your medical history. A physical exam. Tests to find out if your condition is short-term (acute) or long-term (chronic). Your doctor may: Check your nose for growths (polyps). Check your sinuses using a tool that has a light on one end (endoscope). Check for allergies or germs. Do imaging tests, such as an MRI or CT scan. How is this treated? Treatment for this condition depends on the cause and whether it is short-term or long-term. If caused by a virus, your symptoms should go away on their own within 10 days. You may be given medicines to relieve symptoms. They include: Medicines that shrink swollen tissue in the nose. A spray that treats swelling of the nostrils. Rinses that help get rid of thick mucus in your nose (nasal saline washes). Medicines that treat allergies (antihistamines). Over-the-counter pain relievers. If caused by bacteria, your doctor may wait to see if you will get better without treatment. You may be given antibiotic medicine if you have: A very bad infection. A weak body defense system. If caused by growths in the nose, surgery may be needed. Follow these instructions at home: Medicines Take, use, or apply over-the-counter and prescription medicines only as told by your doctor. These may include nasal sprays. If you were prescribed an antibiotic medicine, take it as told by your doctor. Do not stop taking it even if you start to feel better. Hydrate and humidify  Drink enough water to keep your pee (urine) pale yellow. Use a cool mist humidifier to keep the humidity level in your home above 50%. Breathe in steam for 10-15 minutes, 3-4 times a day, or as told by your doctor. You can do  this in the bathroom while a hot shower is running. Try not to spend time in cool or dry air. Rest Rest as much as you can. Sleep with your head raised (elevated). Make sure you get enough sleep each night. General instructions  Put a warm, moist washcloth on your face 3-4 times a day, or as often as told by your doctor. Use nasal saline washes as often as told by your doctor. Wash your hands often with soap and water. If you cannot use soap and water, use hand sanitizer. Do not smoke. Avoid being around people who are smoking (secondhand smoke). Keep all follow-up visits. Contact a doctor if: You have a fever. Your symptoms get worse. Your symptoms do not get better within 10 days. Get help right away if: You have a very bad headache. You cannot stop vomiting. You have very bad pain or swelling around your face or eyes. You have  trouble seeing. You feel confused. Your neck is stiff. You have trouble breathing. These symptoms may be an emergency. Get help right away. Call 911. Do not wait to see if the symptoms will go away. Do not drive yourself to the hospital. Summary A sinus infection is swelling of your sinuses. Sinuses are hollow spaces in the bones around your face. This condition is caused by tissues in your nose that become inflamed or swollen. This traps germs. These can lead to infection. If you were prescribed an antibiotic medicine, take it as told by your doctor. Do not stop taking it even if you start to feel better. Keep all follow-up visits. This information is not intended to replace advice given to you by your health care provider. Make sure you discuss any questions you have with your health care provider. Document Revised: 01/08/2021 Document Reviewed: 01/08/2021 Elsevier Patient Education  2023 [employer]vier Inc.    If you have been instructed to have an in-person evaluation today at a local Urgent Care facility, please use the link below. It will take you to  a list of all of our available [facility], including address, phone number and hours of operation. Please do not delay care.  [facility]  If you or a family member do not have a primary care provider, use the link below to schedule a visit and establish care. When you choose a [facility] primary care physician or advanced practice provider, you gain a long-term partner in health. Find a Primary Care Provider  Learn more about [facility] in-office and virtual care options: [facility] - Get Care Now

## 2021-11-07 NOTE — Progress Notes (Signed)
Virtual Visit Consent   Natalie Montgomery, you are scheduled for a virtual visit with a [facility] provider today. Just as with appointments in the office, your consent must be obtained to participate. Your consent will be active for this visit and any virtual visit you may have with one of our providers in the next 365 days. If you have a MyChart account, a copy of this consent can be sent to you electronically.  As this is a virtual visit, video technology does not allow for your provider to perform a traditional examination. This may limit your provider's ability to fully assess your condition. If your provider identifies any concerns that need to be evaluated in person or the need to arrange testing (such as labs, EKG, etc.), we will make arrangements to do so. Although advances in technology are sophisticated, we cannot ensure that it will always work on either your end or our end. If the connection with a video visit is poor, the visit may have to be switched to a telephone visit. With either a video or telephone visit, we are not always able to ensure that we have a secure connection.  By engaging in this virtual visit, you consent to the provision of healthcare and authorize for your insurance to be billed (if applicable) for the services provided during this visit. Depending on your insurance coverage, you may receive a charge related to this service.  I need to obtain your verbal consent now. Are you willing to proceed with your visit today? Sulma Lamarre has provided verbal consent on 11/07/2021 for a virtual visit (video or telephone). Perlie Mayo, NP  Date: 11/07/2021 8:50 AM  Virtual Visit via Video Note   I, Perlie Mayo, connected with  Natalie Montgomery  ([id number], 06-02-1985) on 11/07/21 at  8:45 AM EDT by a video-enabled telemedicine application and verified that I am speaking with the correct person using two identifiers.  Location: Patient: Virtual  Visit Location Patient: Home Provider: Virtual Visit Location Provider: Home Office   I discussed the limitations of evaluation and management by telemedicine and the availability of in person appointments. The patient expressed understanding and agreed to proceed.    History of Present Illness: Natalie Montgomery is a 36 y.o. who identifies as a female who was assigned female at birth, and is being seen today for worsening symptoms of sinus issues. Was treated with Amoxicillin last week for ear infection, but symptoms worsen with URI -including low grade fever and cough that is keeping her up, in addition to nasal drainage increase and sinus pressure.   Took covid test it was negative  Denies chest pain, shortness of breath  Problems:  Patient Active Problem List   Diagnosis Date Noted   Carpal tunnel syndrome, right upper limb 11/03/2019   Carpal tunnel syndrome on left 11/03/2019   Right knee pain 02/15/2019   Adverse food reaction 10/06/2018   Other allergic rhinitis 10/06/2018   Chest pain 02/03/2017   Costal margin pain 02/03/2017   Back pain 02/03/2017   Anxiety 02/03/2017   Dizziness 02/03/2017   Sacroiliac inflammation ([facility]) 11/05/2015    Allergies:  Allergies  Allergen Reactions   Shellfish Allergy Hives   Pecan Nut (Diagnostic)    Medications:  Current Outpatient Medications:    ALPRAZolam (XANAX) 0.25 MG tablet, 1-2 tablet po bid prn anxiety, Disp: 40 tablet, Rfl: 0   amoxicillin (AMOXIL) 875 MG tablet, Take 1 tablet (875 mg total) by mouth  2 (two) times daily for 7 days., Disp: 14 tablet, Rfl: 0   fluticasone (FLONASE) 50 MCG/ACT nasal spray, Place 2 sprays into both nostrils daily., Disp: 16 g, Rfl: 6   guaiFENesin (MUCINEX) 600 MG 12 hr tablet, Take 2 tablets (1,200 mg total) by mouth 2 (two) times daily., Disp: 30 tablet, Rfl: 2   ibuprofen (ADVIL) 800 MG tablet, Take 1 tablet (800 mg total) by mouth every 8 (eight) hours as needed (pain)., Disp: 21 tablet,  Rfl: 0   levonorgestrel (KYLEENA) 19.5 MG IUD, Kyleena 17.5 mcg/24 hrs (25yrs) 19.5mg  intrauterine device  Take 1 device by intrauterine route., Disp: , Rfl:   Observations/Objective: Patient is well-developed, well-nourished in no acute distress.  Resting comfortably  at home.  Head is normocephalic, atraumatic.  No labored breathing.  Speech is clear and coherent with logical content.  Patient is alert and oriented at baseline.  Nasal tone Cough  Assessment and Plan:   1. Acute bacterial sinusitis  - doxycycline (VIBRA-TABS) 100 MG tablet; Take 1 tablet (100 mg total) by mouth 2 (two) times daily for 7 days.  Dispense: 14 tablet; Refill: 0 - fluconazole (DIFLUCAN) 150 MG tablet; Take 1 tablet (150 mg total) by mouth as directed. May repeat in 3-4 days if not improved  Dispense: 1 tablet; Refill: 0  2. Acute cough  - promethazine-dextromethorphan (PROMETHAZINE-DM) 6.25-15 MG/5ML syrup; Take 5 mLs by mouth 4 (four) times daily as needed for cough.  Dispense: 118 mL; Refill: 0   -rest -retest on in date covid test -hydrate -flonase, mucinex, OTC as needed -above tx measures given the increase in symptoms by current covid neg test, suggest need of different coverage for suspected Sinus infection post a viral URI    Reviewed side effects, risks and benefits of medication.    Patient acknowledged agreement and understanding of the plan.   Past Medical, Surgical, Social History, Allergies, and Medications have been Reviewed.   Follow Up Instructions: I discussed the assessment and treatment plan with the patient. The patient was provided an opportunity to ask questions and all were answered. The patient agreed with the plan and demonstrated an understanding of the instructions.  A copy of instructions were sent to the patient via MyChart unless otherwise noted below.     The patient was advised to call back or seek an in-person evaluation if the symptoms worsen or if the  condition fails to improve as anticipated.  Time:  I spent 15 minutes with the patient via telehealth technology discussing the above problems/concerns.    Perlie Mayo, NP

## 2021-12-09 ENCOUNTER — Ambulatory Visit: Payer: BC Managed Care – PPO | Admitting: Family Medicine

## 2021-12-09 NOTE — Progress Notes (Deleted)
   I, Peterson Lombard, LAT, ATC acting as a scribe for Lynne Leader, MD.  Dezire Turk is a 36 y.o. female who presents to [facility] today for R shoulder pain. Patient was previously seen by Dr. Georgina Snell on 12/20/2020 for R knee pain. Today, pt c/o R shoulder pain x /. Pt locates pain to  Neck pain: Radiates:  UE Numbness/tingling: UE Weakness: Aggravates: Treatments tried:  Pertinent review of systems: ***  Relevant historical information: ***   Exam:  There were no vitals taken for this visit. General: Well Developed, well nourished, and in no acute distress.   MSK: ***    Lab and Radiology Results No results found for this or any previous visit (from the past 72 hour(s)). No results found.     Assessment and Plan: 36 y.o. female with ***   PDMP not reviewed this encounter. No orders of the defined types were placed in this encounter.  No orders of the defined types were placed in this encounter.    Discussed warning signs or symptoms. Please see discharge instructions. Patient expresses understanding.   ***

## 2022-06-10 ENCOUNTER — Ambulatory Visit (INDEPENDENT_AMBULATORY_CARE_PROVIDER_SITE_OTHER): Payer: BC Managed Care – PPO | Admitting: Family Medicine

## 2022-06-10 ENCOUNTER — Encounter: Payer: Self-pay | Admitting: Family Medicine

## 2022-06-10 VITALS — BP 136/87 | HR 96 | Temp 98.1°F | Ht 68.0 in | Wt 221.0 lb

## 2022-06-10 DIAGNOSIS — J0140 Acute pansinusitis, unspecified: Secondary | ICD-10-CM | POA: Diagnosis not present

## 2022-06-10 DIAGNOSIS — F419 Anxiety disorder, unspecified: Secondary | ICD-10-CM | POA: Diagnosis not present

## 2022-06-10 MED ORDER — ALPRAZOLAM 0.25 MG PO TABS: ORAL_TABLET | ORAL | 0 refills | Status: DC

## 2022-06-10 MED ORDER — GUAIFENESIN ER 600 MG PO TB12: 1200.0000 mg | ORAL_TABLET | Freq: Two times a day (BID) | ORAL | 1 refills | Status: DC

## 2022-06-10 MED ORDER — AMOXICILLIN-POT CLAVULANATE 875-125 MG PO TABS: 1.0000 | ORAL_TABLET | Freq: Two times a day (BID) | ORAL | 0 refills | Status: DC

## 2022-06-10 MED ORDER — BENZONATATE 200 MG PO CAPS: 200.0000 mg | ORAL_CAPSULE | Freq: Two times a day (BID) | ORAL | 0 refills | Status: DC | PRN

## 2022-06-10 NOTE — Patient Instructions (Signed)
Continue Flonase. Add Mucinex, Tessalon as needed.  Continue supportive measures including rest, hydration, humidifier use, steam showers, warm compresses to sinuses, warm liquids with lemon and honey, and over-the-counter cough, cold, and analgesics as needed.  If no improvement in 2-3 days, start Augmentin. Left ear drum is starting to look mildly cloudy, so you may be developing an ear infection - this antibiotic should take care of this as well.   Please contact office for follow-up if symptoms do not improve or worsen. Seek emergency care if symptoms become severe.

## 2022-06-10 NOTE — Progress Notes (Signed)
Acute Office Visit  Subjective:     Patient ID: Natalie Montgomery, female    DOB: [birth year], 36 y.o.   MRN: [id number]  CC: URI symptoms   HPI   Upper Respiratory Infection: Patient complains of symptoms of a URI. Symptoms include  cough, abdominal soreness from coughing, fatigue, nasal congestion, rhinorrhea, sinus pressure, headaches, occasional wheezing, bilateral ear pressure, sore throat . Onset of symptoms was 6 days ago (following 2+ weeks of allergy symptoms), gradually worsening since that time.  She is drinking plenty of fluids. Evaluation to date: none. Treatment to date:  Flonase, NyQuil - not helping . She denies fevers, chills, chest pain, dyspnea, rashes, nausea, vomiting, diarrhea.      ROS All review of systems negative except what is listed in the HPI      Objective:    BP 136/87   Pulse 96   Temp 98.1 F (36.7 C) (Oral)   Ht  (1.727 m)   Wt 221 lb (100.2 kg)   SpO2 98%   BMI 33.60 kg/m    Physical Exam Vitals reviewed.  Constitutional:      Appearance: Normal appearance.  HENT:     Head:     Comments: TTP of maxillary sinuses bilaterally     Right Ear: Tympanic membrane normal.     Ears:     Comments: Left TM mildly cloudy, no erythema or bulging    Nose: Congestion and rhinorrhea present.     Mouth/Throat:     Mouth: Mucous membranes are moist.     Pharynx: Oropharynx is clear.  Eyes:     Conjunctiva/sclera: Conjunctivae normal.  Cardiovascular:     Rate and Rhythm: Normal rate and regular rhythm.     Pulses: Normal pulses.     Heart sounds: Normal heart sounds.  Pulmonary:     Effort: Pulmonary effort is normal.     Breath sounds: Normal breath sounds. No wheezing, rhonchi or rales.     Comments: Non-productive cough  Musculoskeletal:     Cervical back: No tenderness.  Lymphadenopathy:     Cervical: No cervical adenopathy.  Skin:    General: Skin is warm and dry.  Neurological:     Mental Status: She is alert and  oriented to person, place, and time.  Psychiatric:        Mood and Affect: Mood normal.        Behavior: Behavior normal.        Thought Content: Thought content normal.        Judgment: Judgment normal.       No results found for any visits on 06/10/22.      Assessment & Plan:   Problem List Items Addressed This Visit     Anxiety States she is almost out of her prescription and would like a refill. Small supply provided - due to see PCP for routine follow-up. PDMP reviewed.    Relevant Medications   ALPRAZolam (XANAX) 0.25 MG tablet   Other Visit Diagnoses     Acute non-recurrent pansinusitis    -  Primary Continue Flonase. Add Mucinex, Tessalon as needed.  Continue supportive measures including rest, hydration, humidifier use, steam showers, warm compresses to sinuses, warm liquids with lemon and honey, and over-the-counter cough, cold, and analgesics as needed.  If no improvement in 2-3 days, start Augmentin. Left ear drum is starting to look mildly cloudy, so you may be developing an ear infection - this antibiotic should take care  of this as well.    Relevant Medications   benzonatate (TESSALON) 200 MG capsule   guaiFENesin (MUCINEX) 600 MG 12 hr tablet   amoxicillin-clavulanate (AUGMENTIN) 875-125 MG tablet       Meds ordered this encounter  Medications   benzonatate (TESSALON) 200 MG capsule    Sig: Take 1 capsule (200 mg total) by mouth 2 (two) times daily as needed for cough.    Dispense:  20 capsule    Refill:  0    Order Specific Question:   Supervising Provider    Answer:   Danise Edge A [[id number]]   ALPRAZolam (XANAX) 0.25 MG tablet    Sig: 1-2 tablet po bid prn anxiety    Dispense:  10 tablet    Refill:  0    Order Specific Question:   Supervising Provider    Answer:   Danise Edge A [[id number]]   guaiFENesin (MUCINEX) 600 MG 12 hr tablet    Sig: Take 2 tablets (1,200 mg total) by mouth 2 (two) times daily.    Dispense:  30 tablet    Refill:  1    Order  Specific Question:   Supervising Provider    Answer:   Danise Edge A [[id number]]   amoxicillin-clavulanate (AUGMENTIN) 875-125 MG tablet    Sig: Take 1 tablet by mouth 2 (two) times daily.    Dispense:  20 tablet    Refill:  0    Order Specific Question:   Supervising Provider    Answer:   Danise Edge A [[id number]]    Return if symptoms worsen or fail to improve, for ; due for PCP follow-up.  Clayborne Dana, NP

## 2022-08-06 DIAGNOSIS — Z01419 Encounter for gynecological examination (general) (routine) without abnormal findings: Secondary | ICD-10-CM | POA: Diagnosis not present

## 2022-08-06 DIAGNOSIS — Z6833 Body mass index (BMI) 33.0-33.9, adult: Secondary | ICD-10-CM | POA: Diagnosis not present

## 2022-08-25 DIAGNOSIS — Z6833 Body mass index (BMI) 33.0-33.9, adult: Secondary | ICD-10-CM | POA: Diagnosis not present

## 2022-08-25 DIAGNOSIS — Z30431 Encounter for routine checking of intrauterine contraceptive device: Secondary | ICD-10-CM | POA: Diagnosis not present

## 2022-09-12 ENCOUNTER — Telehealth: Payer: BC Managed Care – PPO | Admitting: Family Medicine

## 2022-09-12 DIAGNOSIS — U071 COVID-19: Secondary | ICD-10-CM

## 2022-09-12 MED ORDER — NIRMATRELVIR/RITONAVIR (PAXLOVID)TABLET: 3.0000 | ORAL_TABLET | Freq: Two times a day (BID) | ORAL | 0 refills | Status: AC

## 2022-09-12 NOTE — Progress Notes (Signed)
Virtual Visit Consent   Natalie Montgomery, you are scheduled for a virtual visit with a [facility] provider today. Just as with appointments in the office, your consent must be obtained to participate. Your consent will be active for this visit and any virtual visit you may have with one of our providers in the next 365 days. If you have a MyChart account, a copy of this consent can be sent to you electronically.  As this is a virtual visit, video technology does not allow for your provider to perform a traditional examination. This may limit your provider's ability to fully assess your condition. If your provider identifies any concerns that need to be evaluated in person or the need to arrange testing (such as labs, EKG, etc.), we will make arrangements to do so. Although advances in technology are sophisticated, we cannot ensure that it will always work on either your end or our end. If the connection with a video visit is poor, the visit may have to be switched to a telephone visit. With either a video or telephone visit, we are not always able to ensure that we have a secure connection.  By engaging in this virtual visit, you consent to the provision of healthcare and authorize for your insurance to be billed (if applicable) for the services provided during this visit. Depending on your insurance coverage, you may receive a charge related to this service.  I need to obtain your verbal consent now. Are you willing to proceed with your visit today? Natalie Montgomery has provided verbal consent on 09/12/2022 for a virtual visit (video or telephone). Georgana Curio, FNP  Date: 09/12/2022 7:46 PM  Virtual Visit via Video Note   I, Georgana Curio, connected with  Natalie Montgomery  ([id number], 09-04-1985) on 09/12/22 at  7:30 PM EDT by a video-enabled telemedicine application and verified that I am speaking with the correct person using two identifiers.  Location: Patient: Virtual Visit  Location Patient: Home Provider: Virtual Visit Location Provider: Home Office   I discussed the limitations of evaluation and management by telemedicine and the availability of in person appointments. The patient expressed understanding and agreed to proceed.    History of Present Illness: Natalie Montgomery is a 37 y.o. who identifies as a female who was assigned female at birth, and is being seen today for covid positive testing today with sx starting yesterday. Sx include cough, sore throat, headache, fever, without wheezing and sob. Her father died of covid and this is the first time she has had it. She is obese and requests paxlovid. She caught it from her mother in [place].   HPI: HPI  Problems:  Patient Active Problem List   Diagnosis Date Noted   Carpal tunnel syndrome, right upper limb 11/03/2019   Carpal tunnel syndrome on left 11/03/2019   Right knee pain 02/15/2019   Adverse food reaction 10/06/2018   Other allergic rhinitis 10/06/2018   Chest pain 02/03/2017   Costal margin pain 02/03/2017   Back pain 02/03/2017   Anxiety 02/03/2017   Dizziness 02/03/2017   Sacroiliac inflammation (HCC) 11/05/2015    Allergies:  Allergies  Allergen Reactions   Shellfish Allergy Hives   Pecan Nut (Diagnostic)    Medications:  Current Outpatient Medications:    ALPRAZolam (XANAX) 0.25 MG tablet, 1-2 tablet po bid prn anxiety, Disp: 10 tablet, Rfl: 0   amoxicillin-clavulanate (AUGMENTIN) 875-125 MG tablet, Take 1 tablet by mouth 2 (two) times daily., Disp: 20 tablet,  Rfl: 0   benzonatate (TESSALON) 200 MG capsule, Take 1 capsule (200 mg total) by mouth 2 (two) times daily as needed for cough., Disp: 20 capsule, Rfl: 0   fluticasone (FLONASE) 50 MCG/ACT nasal spray, Place 2 sprays into both nostrils daily., Disp: 16 g, Rfl: 6   guaiFENesin (MUCINEX) 600 MG 12 hr tablet, Take 2 tablets (1,200 mg total) by mouth 2 (two) times daily., Disp: 30 tablet, Rfl: 1   levonorgestrel (KYLEENA) 19.5  MG IUD, Kyleena 17.5 mcg/24 hrs (55yrs) 19.5mg  intrauterine device  Take 1 device by intrauterine route., Disp: , Rfl:   Observations/Objective: Patient is well-developed, well-nourished in no acute distress.  Resting comfortably  at home.  Head is normocephalic, atraumatic.  No labored breathing.  Speech is clear and coherent with logical content.  Patient is alert and oriented at baseline.    Assessment and Plan: 1. COVID  2. Morbid obesity (HCC)  Increase fluids, humidifier at night, UC if sx worsen, quarantine discussed, ass Vitd and zinc.   Follow Up Instructions: I discussed the assessment and treatment plan with the patient. The patient was provided an opportunity to ask questions and all were answered. The patient agreed with the plan and demonstrated an understanding of the instructions.  A copy of instructions were sent to the patient via MyChart unless otherwise noted below.     The patient was advised to call back or seek an in-person evaluation if the symptoms worsen or if the condition fails to improve as anticipated.  Time:  I spent 10 minutes with the patient via telehealth technology discussing the above problems/concerns.    Georgana Curio, FNP

## 2022-09-12 NOTE — Patient Instructions (Signed)

## 2022-12-04 DIAGNOSIS — Z30430 Encounter for insertion of intrauterine contraceptive device: Secondary | ICD-10-CM | POA: Diagnosis not present

## 2022-12-04 DIAGNOSIS — Z3202 Encounter for pregnancy test, result negative: Secondary | ICD-10-CM | POA: Diagnosis not present

## 2023-01-05 ENCOUNTER — Other Ambulatory Visit: Payer: Self-pay | Admitting: Family Medicine

## 2023-01-05 DIAGNOSIS — F419 Anxiety disorder, unspecified: Secondary | ICD-10-CM

## 2023-01-05 NOTE — Telephone Encounter (Signed)
Requesting: Xanax 0.25 Contract: N/A UDS: N/A Last Visit: 06/10/2022 Next Visit: N/A Last Refill: 06/10/2022  Please Advise

## 2023-02-15 ENCOUNTER — Other Ambulatory Visit: Payer: Self-pay

## 2023-02-15 ENCOUNTER — Emergency Department (HOSPITAL_BASED_OUTPATIENT_CLINIC_OR_DEPARTMENT_OTHER): Payer: BC Managed Care – PPO

## 2023-02-15 ENCOUNTER — Encounter (HOSPITAL_BASED_OUTPATIENT_CLINIC_OR_DEPARTMENT_OTHER): Payer: Self-pay

## 2023-02-15 ENCOUNTER — Emergency Department (HOSPITAL_BASED_OUTPATIENT_CLINIC_OR_DEPARTMENT_OTHER)
Admission: EM | Admit: 2023-02-15 | Discharge: 2023-02-15 | Disposition: A | Discharge status: Home / Self Care | Payer: BC Managed Care – PPO | Attending: Emergency Medicine | Admitting: Emergency Medicine

## 2023-02-15 DIAGNOSIS — K5732 Diverticulitis of large intestine without perforation or abscess without bleeding: Secondary | ICD-10-CM | POA: Diagnosis not present

## 2023-02-15 DIAGNOSIS — K429 Umbilical hernia without obstruction or gangrene: Secondary | ICD-10-CM | POA: Diagnosis not present

## 2023-02-15 DIAGNOSIS — K760 Fatty (change of) liver, not elsewhere classified: Secondary | ICD-10-CM | POA: Diagnosis not present

## 2023-02-15 DIAGNOSIS — R109 Unspecified abdominal pain: Secondary | ICD-10-CM | POA: Diagnosis not present

## 2023-02-15 DIAGNOSIS — Z91010 Allergy to peanuts: Secondary | ICD-10-CM | POA: Diagnosis not present

## 2023-02-15 DIAGNOSIS — K5792 Diverticulitis of intestine, part unspecified, without perforation or abscess without bleeding: Secondary | ICD-10-CM

## 2023-02-15 DIAGNOSIS — R1032 Left lower quadrant pain: Secondary | ICD-10-CM | POA: Diagnosis not present

## 2023-02-15 HISTORY — DX: Anxiety disorder, unspecified: F41.9

## 2023-02-15 LAB — CBC WITH DIFFERENTIAL/PLATELET
Abs Immature Granulocytes: 0.02 10*3/uL (ref 0.00–0.07)
Basophils Absolute: 0.1 10*3/uL (ref 0.0–0.1)
Basophils Relative: 1 %
Eosinophils Absolute: 0.2 10*3/uL (ref 0.0–0.5)
Eosinophils Relative: 2 %
HCT: 41.8 % (ref 36.0–46.0)
Hemoglobin: 14.3 g/dL (ref 12.0–15.0)
Immature Granulocytes: 0 %
Lymphocytes Relative: 13 %
Lymphs Abs: 1.3 10*3/uL (ref 0.7–4.0)
MCH: 30.3 pg (ref 26.0–34.0)
MCHC: 34.2 g/dL (ref 30.0–36.0)
MCV: 88.6 fL (ref 80.0–100.0)
Monocytes Absolute: 0.6 10*3/uL (ref 0.1–1.0)
Monocytes Relative: 6 %
Neutro Abs: 7.7 10*3/uL (ref 1.7–7.7)
Neutrophils Relative %: 78 %
Platelets: 183 10*3/uL (ref 150–400)
RBC: 4.72 MIL/uL (ref 3.87–5.11)
RDW: 12.9 % (ref 11.5–15.5)
WBC: 9.8 10*3/uL (ref 4.0–10.5)
nRBC: 0 % (ref 0.0–0.2)

## 2023-02-15 LAB — COMPREHENSIVE METABOLIC PANEL
ALT: 32 U/L (ref 0–44)
AST: 18 U/L (ref 15–41)
Albumin: 4.3 g/dL (ref 3.5–5.0)
Alkaline Phosphatase: 35 U/L — ABNORMAL LOW (ref 38–126)
Anion gap: 7 (ref 5–15)
BUN: 6 mg/dL (ref 6–20)
CO2: 23 mmol/L (ref 22–32)
Calcium: 9.1 mg/dL (ref 8.9–10.3)
Chloride: 107 mmol/L (ref 98–111)
Creatinine, Ser: 0.6 mg/dL (ref 0.44–1.00)
GFR, Estimated: >60 mL/min (ref >60)
Glucose, Bld: 106 mg/dL — ABNORMAL HIGH (ref 70–99)
Potassium: 3.8 mmol/L (ref 3.5–5.1)
Sodium: 137 mmol/L (ref 135–145)
Total Bilirubin: 0.6 mg/dL (ref <1.2)
Total Protein: 7.6 g/dL (ref 6.5–8.1)

## 2023-02-15 LAB — URINALYSIS, W/ REFLEX TO CULTURE (INFECTION SUSPECTED)
Bilirubin Urine: NEGATIVE
Glucose, UA: NEGATIVE mg/dL
Hgb urine dipstick: NEGATIVE
Ketones, ur: NEGATIVE mg/dL
Leukocytes,Ua: NEGATIVE
Nitrite: NEGATIVE
Protein, ur: NEGATIVE mg/dL
Specific Gravity, Urine: 1.02 (ref 1.005–1.030)
pH: 6.5 (ref 5.0–8.0)

## 2023-02-15 LAB — LIPASE, BLOOD: Lipase: 25 U/L (ref 11–51)

## 2023-02-15 LAB — PREGNANCY, URINE: Preg Test, Ur: NEGATIVE

## 2023-02-15 MED ORDER — AMOXICILLIN-POT CLAVULANATE 875-125 MG PO TABS: 1.0000 | ORAL_TABLET | Freq: Two times a day (BID) | ORAL | 0 refills | Status: DC

## 2023-02-15 MED ORDER — ONDANSETRON HCL 4 MG/2ML IJ SOLN
4.0000 mg | Freq: Once | INTRAMUSCULAR | Status: AC
Start: 2023-02-15 — End: 2023-02-15
  Administered 2023-02-15: 4 mg via INTRAVENOUS
  Filled 2023-02-15: qty 2

## 2023-02-15 MED ORDER — FENTANYL CITRATE PF 50 MCG/ML IJ SOSY
50.0000 ug | PREFILLED_SYRINGE | Freq: Once | INTRAMUSCULAR | Status: AC
  Administered 2023-02-15: 50 ug via INTRAVENOUS
  Filled 2023-02-15: qty 1

## 2023-02-15 MED ORDER — IOHEXOL 300 MG/ML  SOLN
100.0000 mL | Freq: Once | INTRAMUSCULAR | Status: AC | PRN
  Administered 2023-02-15: 100 mL via INTRAVENOUS

## 2023-02-15 MED ORDER — HYDROCODONE-ACETAMINOPHEN 5-325 MG PO TABS: 1.0000 | ORAL_TABLET | Freq: Four times a day (QID) | ORAL | 0 refills | Status: DC | PRN

## 2023-02-15 NOTE — ED Triage Notes (Signed)
In for eval of cramping abd pain onset Tuesday. Took ibuprofen last pm with mild relief. New IUD end of October, no true menstrual period. Denies nausea or vomiting. Last BM 0600 today and normal.

## 2023-02-15 NOTE — ED Provider Notes (Signed)
[place] EMERGENCY DEPARTMENT AT [facility] ([place] Provider Note   CSN: [id number] Arrival date & time: 02/15/23  0756     History  Chief Complaint  Patient presents with   Abdominal Pain    Natalie Montgomery is a 37 y.o. female.   Abdominal Pain Associated symptoms: nausea      37 year old female presenting to the emergency department with a chief complaint of abdominal pain.  The patient states that the pain came on this past Tuesday.  She took ibuprofen last night with mild some mild relief.  She thought initially could have been a ruptured cyst and called her OB/GYN who stated that she should continue to take an and to go to the emergency department if pain got worse.  She endorses nausea, denies any vomiting.  Her last bowel movement was at 0 600 today and was normal.  She denies any genitourinary symptoms.  She had slight bleeding 2 weeks ago but has an IUD in place.  No fevers or chills.  Home Medications Prior to Admission medications   Medication Sig Start Date End Date Taking? Authorizing Provider  amoxicillin-clavulanate (AUGMENTIN) 875-125 MG tablet Take 1 tablet by mouth every 12 (twelve) hours. 02/15/23  Yes Ernie Avena, MD  fluticasone (FLONASE) 50 MCG/ACT nasal spray Place 2 sprays into both nostrils daily. 12/03/18  Yes Olive Bass, FNP  HYDROcodone-acetaminophen (NORCO/VICODIN) 5-325 MG tablet Take 1-2 tablets by mouth every 6 (six) hours as needed for severe pain (pain score 7-10). 02/15/23  Yes Ernie Avena, MD  levonorgestrel [facility]) 19.5 MG IUD Kyleena 17.5 mcg/24 hrs (30yrs) 19.5mg  intrauterine device  Take 1 device by intrauterine route.   Yes [provider]  ALPRAZolam Prudy Feeler) 0.25 MG tablet 1-2 tablet po bid prn anxiety 06/10/22   Clayborne Dana, NP      Allergies    Shellfish allergy and Pecan nut (diagnostic)    Review of Systems   Review of Systems  Gastrointestinal:  Positive for abdominal pain and nausea.   All other systems reviewed and are negative.   Physical Exam Updated Vital Signs BP 125/82   Pulse 86   Temp 98.4 F (36.9 C) (Oral)   Resp 15   Ht 5' 7.32" (1.71 m)   Wt 95.3 kg   SpO2 95%   BMI 32.58 kg/m  Physical Exam Vitals and nursing note reviewed.  Constitutional:      General: She is not in acute distress.    Appearance: She is well-developed.  HENT:     Head: Normocephalic and atraumatic.  Eyes:     Conjunctiva/sclera: Conjunctivae normal.  Cardiovascular:     Rate and Rhythm: Normal rate and regular rhythm.  Pulmonary:     Effort: Pulmonary effort is normal. No respiratory distress.     Breath sounds: Normal breath sounds.  Abdominal:     Palpations: Abdomen is soft.     Tenderness: There is abdominal tenderness in the left lower quadrant. There is no guarding or rebound.  Musculoskeletal:        General: No swelling.     Cervical back: Neck supple.  Skin:    General: Skin is warm and dry.     Capillary Refill: Capillary refill takes less than 2 seconds.  Neurological:     Mental Status: She is alert.  Psychiatric:        Mood and Affect: Mood normal.     ED Results / Procedures / Treatments   Labs (all labs ordered  are listed, but only abnormal results are displayed) Labs Reviewed  COMPREHENSIVE METABOLIC PANEL - Abnormal; Notable for the following components:      Result Value   Glucose, Bld 106 (*)    Alkaline Phosphatase 35 (*)    All other components within normal limits  URINALYSIS, W/ REFLEX TO CULTURE (INFECTION SUSPECTED) - Abnormal; Notable for the following components:   Bacteria, UA RARE (*)    All other components within normal limits  CBC WITH DIFFERENTIAL/PLATELET  LIPASE, BLOOD  PREGNANCY, URINE    EKG None  Radiology CT ABDOMEN PELVIS W CONTRAST Result Date: 02/15/2023 CLINICAL DATA:  Left lower quadrant abdominal pain. EXAM: CT ABDOMEN AND PELVIS WITH CONTRAST TECHNIQUE: Multidetector CT imaging of the abdomen and  pelvis was performed using the standard protocol following bolus administration of intravenous contrast. RADIATION DOSE REDUCTION: This exam was performed according to the departmental dose-optimization program which includes automated exposure control, adjustment of the mA and/or kV according to patient size and/or use of iterative reconstruction technique. CONTRAST:  OMNIPAQUE IOHEXOL 300 MG/ML  SOLN COMPARISON:  None Available. FINDINGS: Lower chest: There are patchy atelectatic changes in the visualized lung bases. No overt consolidation. No pleural effusion. The heart is normal in size. No pericardial effusion. Hepatobiliary: The liver is normal in size. Non-cirrhotic configuration. No suspicious mass. These is mild diffuse hepatic steatosis. No intrahepatic or extrahepatic bile duct dilation. No calcified gallstones. Normal gallbladder wall thickness. No pericholecystic inflammatory changes. Pancreas: Unremarkable. No pancreatic ductal dilatation or surrounding inflammatory changes. Spleen: Within normal limits. No focal lesion. Adrenals/Urinary Tract: Adrenal glands are unremarkable. No suspicious renal mass. There several, peripheral, wedge-shaped, heterogeneous relatively hypoattenuating areas in the left renal cortex, reaching up to the surface. These are nonspecific but can be seen as a sequela of chronic versus acute pyelonephritis. Correlate clinically and with urinalysis. No obstructive uropathy or nephroureterolithiasis. Unremarkable urinary bladder. Stomach/Bowel: There is short segment of proximal sigmoid colon (approximately 4.5-5 cm long), exhibiting moderate irregular wall thickening and pericolonic fat stranding on the background of a diverticulum, compatible with acute uncomplicated diverticulitis. No walled off abscess or loculated collection. No pneumoperitoneum. No disproportionate dilation of small or large bowel loops to suggest bowel obstruction. Unremarkable appendix.  Vascular/Lymphatic: There is trace amount of reactive ascites in the dependent pelvis. No pneumoperitoneum. No abdominal or pelvic lymphadenopathy, by size criteria. No aneurysmal dilation of the major abdominal arteries. Reproductive: Normal-size anteverted uterus containing a T-shaped intrauterine device, which appears in satisfactory position. No large adnexal mass seen. Other: There is a tiny fat containing umbilical hernia. The soft tissues and abdominal wall are otherwise unremarkable. Musculoskeletal: No suspicious osseous lesions. IMPRESSION: 1. Acute uncomplicated proximal sigmoid diverticulitis, as described above. 2. There are several peripheral wedge-shaped heterogeneous hypoattenuating areas in the left renal cortex, reaching up to the surface. These are nonspecific but can be seen as a sequela of chronic versus acute pyelonephritis. Correlate clinically and with urinalysis. 3. Multiple other nonacute observations, as described above. Electronically Signed   By: Jules Schick M.D.   On: 02/15/2023 10:20    Procedures Procedures    Medications Ordered in ED Medications  fentaNYL (SUBLIMAZE) injection 50 mcg (50 mcg Intravenous Given 02/15/23 0855)  ondansetron (ZOFRAN) injection 4 mg (4 mg Intravenous Given 02/15/23 0855)  iohexol (OMNIPAQUE) 300 MG/ML solution 100 mL (100 mLs Intravenous Contrast Given 02/15/23 0946)    ED Course/ Medical Decision Making/ A&P  Medical Decision Making Amount and/or Complexity of Data Reviewed Labs: ordered. Radiology: ordered.  Risk Prescription drug management.     37 year old female presenting to the emergency department with a chief complaint of abdominal pain.  The patient states that the pain came on this past Tuesday.  She took ibuprofen last night with mild some mild relief.  She thought initially could have been a ruptured cyst and called her OB/GYN who stated that she should continue to take an and to  go to the emergency department if pain got worse.  She endorses nausea, denies any vomiting.  Her last bowel movement was at 0 600 today and was normal.  She denies any genitourinary symptoms.  She had slight bleeding 2 weeks ago but has an IUD in place.  No fevers or chills.  Medical Decision Making:   Natalie Montgomery is a 37 y.o. female who presented to the ED today with abdominal pain, detailed above.    Patient placed on continuous vitals and telemetry monitoring while in ED which was reviewed periodically.  Complete initial physical exam performed, notably the patient  was tender in the LLQ.     Reviewed and confirmed nursing documentation for past medical history, family history, social history.    Initial Assessment:   With the patient's presentation of abdominal pain, most likely diagnosis is diverticulitis. Other diagnoses were considered including (but not limited to) gastroenteritis, colitis, small bowel obstruction, appendicitis, cholecystitis, pancreatitis, nephrolithiasis, UTI, pyelonephritis, ruptured ectopic pregnancy, PID, ovarian torsion, ruptured ovarian cyst. These are considered less likely due to history of present illness and physical exam findings.   This is most consistent with an acute complicated illness   Initial Plan:  CBC/CMP to evaluate for underlying infectious/metabolic etiology for patient's abdominal pain  Lipase to evaluate for pancreatitis  EKG to evaluate for cardiac source of pain  CTAB/Pelvis with contrast to evaluate for structural/surgical etiology of patients' severe abdominal pain.  Urinalysis and repeat physical assessment to evaluate for UTI/Pyelonpehritis  Empiric management of symptoms with escalating pain control and antiemetics as needed.   Initial Study Results:   Laboratory  All laboratory results reviewed without evidence of clinically relevant pathology.   Exceptions include: none      Radiology All images reviewed  independently. Agree with radiology report at this time.   CT ABDOMEN PELVIS W CONTRAST Result Date: 02/15/2023 CLINICAL DATA:  Left lower quadrant abdominal pain. EXAM: CT ABDOMEN AND PELVIS WITH CONTRAST TECHNIQUE: Multidetector CT imaging of the abdomen and pelvis was performed using the standard protocol following bolus administration of intravenous contrast. RADIATION DOSE REDUCTION: This exam was performed according to the departmental dose-optimization program which includes automated exposure control, adjustment of the mA and/or kV according to patient size and/or use of iterative reconstruction technique. CONTRAST:  OMNIPAQUE IOHEXOL 300 MG/ML  SOLN COMPARISON:  None Available. FINDINGS: Lower chest: There are patchy atelectatic changes in the visualized lung bases. No overt consolidation. No pleural effusion. The heart is normal in size. No pericardial effusion. Hepatobiliary: The liver is normal in size. Non-cirrhotic configuration. No suspicious mass. These is mild diffuse hepatic steatosis. No intrahepatic or extrahepatic bile duct dilation. No calcified gallstones. Normal gallbladder wall thickness. No pericholecystic inflammatory changes. Pancreas: Unremarkable. No pancreatic ductal dilatation or surrounding inflammatory changes. Spleen: Within normal limits. No focal lesion. Adrenals/Urinary Tract: Adrenal glands are unremarkable. No suspicious renal mass. There several, peripheral, wedge-shaped, heterogeneous relatively hypoattenuating areas in the left renal cortex, reaching up to the surface.  These are nonspecific but can be seen as a sequela of chronic versus acute pyelonephritis. Correlate clinically and with urinalysis. No obstructive uropathy or nephroureterolithiasis. Unremarkable urinary bladder. Stomach/Bowel: There is short segment of proximal sigmoid colon (approximately 4.5-5 cm long), exhibiting moderate irregular wall thickening and pericolonic fat stranding on the background  of a diverticulum, compatible with acute uncomplicated diverticulitis. No walled off abscess or loculated collection. No pneumoperitoneum. No disproportionate dilation of small or large bowel loops to suggest bowel obstruction. Unremarkable appendix. Vascular/Lymphatic: There is trace amount of reactive ascites in the dependent pelvis. No pneumoperitoneum. No abdominal or pelvic lymphadenopathy, by size criteria. No aneurysmal dilation of the major abdominal arteries. Reproductive: Normal-size anteverted uterus containing a T-shaped intrauterine device, which appears in satisfactory position. No large adnexal mass seen. Other: There is a tiny fat containing umbilical hernia. The soft tissues and abdominal wall are otherwise unremarkable. Musculoskeletal: No suspicious osseous lesions. IMPRESSION: 1. Acute uncomplicated proximal sigmoid diverticulitis, as described above. 2. There are several peripheral wedge-shaped heterogeneous hypoattenuating areas in the left renal cortex, reaching up to the surface. These are nonspecific but can be seen as a sequela of chronic versus acute pyelonephritis. Correlate clinically and with urinalysis. 3. Multiple other nonacute observations, as described above. Electronically Signed   By: Jules Schick M.D.   On: 02/15/2023 10:20       Final Reassessment and Plan:   Patient overall well-appearing, pain improved, laboratory evaluation unremarkable, pregnancy negative, CT imaging revealing evidence of acute uncomplicated sigmoid diverticulitis which we will treat with a course of Norco for pain control and Augmentin.  Return precautions provided in the event of severe worsening symptoms.  Stable for discharge.   Final Clinical Impression(s) / ED Diagnoses Final diagnoses:  Diverticulitis    Rx / DC Orders ED Discharge Orders          Ordered    amoxicillin-clavulanate (AUGMENTIN) 875-125 MG tablet  Every 12 hours        02/15/23 1030    HYDROcodone-acetaminophen  (NORCO/VICODIN) 5-325 MG tablet  Every 6 hours PRN        02/15/23 1030              Ernie Avena, MD 02/15/23 1031

## 2023-02-16 ENCOUNTER — Telehealth: Payer: Self-pay | Admitting: *Deleted

## 2023-02-16 NOTE — Transitions of Care (Post Inpatient/ED Visit) (Signed)
   02/16/2023  Name: Avee Parello MRN: [id number] DOB: 09/18/1985  Today's TOC FU Call Status: Today's TOC FU Call Status:: Unsuccessful Call (1st Attempt) Unsuccessful Call (1st Attempt) Date: 02/16/23  Attempted to reach the patient regarding the most recent Inpatient/ED visit.  Follow Up Plan: Additional outreach attempts will be made to reach the patient to complete the Transitions of Care (Post Inpatient/ED visit) call.   Signature Frederika Hukill, [facility]

## 2023-02-17 ENCOUNTER — Telehealth: Payer: Self-pay | Admitting: *Deleted

## 2023-02-17 NOTE — Transitions of Care (Post Inpatient/ED Visit) (Signed)
   02/17/2023  Name: Natalie Montgomery MRN: [id number] DOB: 02-Mar-1985  Today's TOC FU Call Status: Today's TOC FU Call Status:: Unsuccessful Call (2nd Attempt) Unsuccessful Call (2nd Attempt) Date: 02/17/23  Attempted to reach the patient regarding the most recent Inpatient/ED visit.  Follow Up Plan: Additional outreach attempts will be made to reach the patient to complete the Transitions of Care (Post Inpatient/ED visit) call.   Signature Kathlene Yano, [facility]

## 2023-02-24 ENCOUNTER — Ambulatory Visit: Payer: BC Managed Care – PPO | Admitting: Family

## 2023-02-24 ENCOUNTER — Encounter: Payer: Self-pay | Admitting: Family

## 2023-02-24 VITALS — BP 116/70 | HR 94 | Ht 67.0 in | Wt 202.2 lb

## 2023-02-24 DIAGNOSIS — F419 Anxiety disorder, unspecified: Secondary | ICD-10-CM

## 2023-02-24 DIAGNOSIS — K5792 Diverticulitis of intestine, part unspecified, without perforation or abscess without bleeding: Secondary | ICD-10-CM

## 2023-02-24 MED ORDER — ALPRAZOLAM 0.25 MG PO TABS: ORAL_TABLET | ORAL | 0 refills | Status: AC

## 2023-02-24 NOTE — Patient Instructions (Addendum)
 Please call Nestor Ramp to schedule a TOC- ask for [facility] or Jiles Prows; [phone]

## 2023-02-24 NOTE — Progress Notes (Signed)
 Natalie Montgomery is a 38 y.o. female with the following history as recorded in Epic[employer]:  Patient Active Problem List   Diagnosis Date Noted   Carpal tunnel syndrome, right upper limb 11/03/2019   Carpal tunnel syndrome on left 11/03/2019   Right knee pain 02/15/2019   Adverse food reaction 10/06/2018   Other allergic rhinitis 10/06/2018   Chest pain 02/03/2017   Costal margin pain 02/03/2017   Back pain 02/03/2017   Anxiety 02/03/2017   Dizziness 02/03/2017   Sacroiliac inflammation (HCC) 11/05/2015    Current Outpatient Medications  Medication Sig Dispense Refill   fluticasone  (FLONASE ) 50 MCG/ACT nasal spray Place 2 sprays into both nostrils daily. 16 g 6   levonorgestrel  (KYLEENA ) 19.5 MG IUD Kyleena  17.5 mcg/24 hrs (25yrs) 19.5mg  intrauterine device  Take 1 device by intrauterine route.     ALPRAZolam  (XANAX ) 0.25 MG tablet 1-2 tablet po bid prn anxiety 20 tablet 0   No current facility-administered medications for this visit.    Allergies: Shellfish allergy  and Pecan nut (diagnostic)  Past Medical History:  Diagnosis Date   Anxiety    Back pain    History of chicken pox    History of kidney stones     Past Surgical History:  Procedure Laterality Date   WISDOM TOOTH EXTRACTION     2020    Family History  Problem Relation Age of Onset   Hypertension Mother    Diabetes Mother    Hypertension Father    Pulmonary embolism Sister    Multiple sclerosis Maternal Grandmother    Heart disease Maternal Grandfather     Social History   Tobacco Use   Smoking status: Never   Smokeless tobacco: Never  Substance Use Topics   Alcohol use: Yes    Alcohol/week: 4.0 standard drinks of alcohol    Types: 1 Glasses of wine, 1 Cans of beer, 1 Shots of liquor, 1 Standard drinks or equivalent per week    Subjective:   Patient was seen in the ER on 12/29 with diagnosis of diverticulitis; notes she is feeling much better today- no acute concerns; no further abdominal pain/  has had a normal bowel movement; no fever, blood in stool;   Objective:  Vitals:   02/24/23 0834  BP: 116/70  Pulse: 94  SpO2: 98%  Weight: 202 lb 3.2 oz (91.7 kg)  Height: 5' 7 (1.702 m)    General: Well developed, well nourished, in no acute distress  Skin : Warm and dry.  Head: Normocephalic and atraumatic  Eyes: Sclera and conjunctiva clear; pupils round and reactive to light; extraocular movements intact  Ears: External normal; canals clear; tympanic membranes normal  Oropharynx: Pink, supple. No suspicious lesions  Neck: Supple without thyromegaly, adenopathy  Lungs: Respirations unlabored; clear to auscultation bilaterally without wheeze, rales, rhonchi  CVS exam: normal rate and regular rhythm.  Abdomen: Soft; nontender; nondistended; normoactive bowel sounds; no masses or hepatosplenomegaly  Neurologic: Alert and oriented; speech intact; face symmetrical; moves all extremities well; CNII-XII intact without focal deficit   Assessment:  1. Diverticulitis   2. Anxiety     Plan:  Improved; refer to GI for further evaluation; discussed need for high fiber diet;  Rx for Xanax  to use as needed; patient is flying to [place]  later this week;   She will reach out to [facility] location to set up new provider as this location is closer to her home;   No follow-ups on file.  Orders Placed This Encounter  Procedures   Ambulatory referral to Gastroenterology    Referral Priority:   Routine    Referral Type:   Consultation    Referral Reason:   Specialty Services Required    Number of Visits Requested:   1    Requested Prescriptions   Signed Prescriptions Disp Refills   ALPRAZolam  (XANAX ) 0.25 MG tablet 20 tablet 0    Sig: 1-2 tablet po bid prn anxiety

## 2023-03-19 ENCOUNTER — Encounter: Payer: Self-pay | Admitting: Internal Medicine

## 2023-03-19 ENCOUNTER — Encounter: Payer: Self-pay | Admitting: Nurse Practitioner

## 2023-03-19 ENCOUNTER — Ambulatory Visit: Payer: BC Managed Care – PPO | Admitting: Nurse Practitioner

## 2023-03-19 VITALS — BP 110/70 | HR 97 | Ht 67.0 in | Wt 187.0 lb

## 2023-03-19 DIAGNOSIS — K5792 Diverticulitis of intestine, part unspecified, without perforation or abscess without bleeding: Secondary | ICD-10-CM

## 2023-03-19 DIAGNOSIS — K5900 Constipation, unspecified: Secondary | ICD-10-CM

## 2023-03-19 DIAGNOSIS — R634 Abnormal weight loss: Secondary | ICD-10-CM | POA: Diagnosis not present

## 2023-03-19 DIAGNOSIS — K5732 Diverticulitis of large intestine without perforation or abscess without bleeding: Secondary | ICD-10-CM

## 2023-03-19 MED ORDER — NA SULFATE-K SULFATE-MG SULF 17.5-3.13-1.6 GM/177ML PO SOLN: ORAL | 0 refills | Status: DC

## 2023-03-19 NOTE — Progress Notes (Signed)
Brief Narrative 38 y.o. yo female new to the practice with a past medical history not limited to hepatic steatosis, diverticulosis/diverticulitis, and anxiety.  Patient referred by PCP for diverticulitis   ASSESSMENT    Uncomplicated sigmoid diverticulitis 12/29.   This was her first episode. Symptoms resolved with a course of amoxicillin  Weight loss.  Her weight is down 23 pounds over the last month which she attributes to major dietary changes following episode of diverticulitis  Acute, mild constipation with decreased frequency of bowel movements Constipation likely due to reduction/omission of fiber from diet following bout of diverticulitis  See PMH for any additional medical history   PLAN    --Schedule for a colonoscopy to exclude colon neoplasm The risks and benefits of colonoscopy with possible polypectomy / biopsies were discussed and the patient agrees to proceed. Her symptoms have resolved. Will schedule for colonoscopy to be done on 2/7 ( almost 6 weeks from diagnosis) --Call us in the interim for any recurrent LLQ pain. --Glycerin suppositories as needed. If needed can add Miralax 1 capful mixed in 8 ounces of water daily    HPI   Chief complaint : Recent diverticulitis  Patient was seen in the ED 02/15/2023 for acute LLQ pain.   CT scan showed acute uncomplicated proximal sigmoid diverticulitis.  Her labs were unremarkable including lipase, CBC and CMP. Pregnancy test negative .  This was her first episode of diverticulitis She was prescribed and completed a course of amoxicillin with resolution of pain.  Since being diagnosed with diverticulitis she has avoided red meat and high-fiber foods.  A couple of weeks ago she had some recurrent but mild LLQ discomfort.  She put herself back on clear liquids for couple days and symptoms resolved.  She has felt okay since except for some constipation felt to be due to decreased fiber in her diet.  She would like to start  eating salads again.  The dietary restrictions have resulted in significant weight loss.  Her weight is down 23 pounds from 210 to 187 pounds.   Natalie Montgomery typically has normal bowel habits with occasional diarrhea depending on what she eats.  Lately she has not been having a daily bowel movement .   Natalie Montgomery has not seen any blood in her stools.  She has no known family history of colon cancer.  Labs      Latest Ref Rng & Units 02/15/2023    8:48 AM 08/23/2019   10:53 AM 02/03/2017    5:06 PM  CBC  WBC 4.0 - 10.5 K/uL 9.8  4.3  8.2   Hemoglobin 12.0 - 15.0 g/dL 95.6  21.3  08.6   Hematocrit 36.0 - 46.0 % 41.8  42.4  44.7   Platelets 150 - 400 K/uL 183  153.0  266.0     Lab Results  Component Value Date   LIPASE 25 02/15/2023      Latest Ref Rng & Units 02/15/2023    8:48 AM 08/23/2019   10:53 AM 02/03/2017    5:06 PM  CMP  Glucose 70 - 99 mg/dL 578  91  88   BUN 6 - 20 mg/dL 6  5  9    Creatinine 0.44 - 1.00 mg/dL 4.69  6.29  5.28   Sodium 135 - 145 mmol/L 137  142  138   Potassium 3.5 - 5.1 mmol/L 3.8  3.8  3.9   Chloride 98 - 111 mmol/L 107  104  102   CO2  22 - 32 mmol/L 23  26  29    Calcium 8.9 - 10.3 mg/dL 9.1  9.5  9.4   Total Protein 6.5 - 8.1 g/dL 7.6  7.2  8.2   Total Bilirubin <1.2 mg/dL 0.6  0.4  0.4   Alkaline Phos 38 - 126 U/L 35  37  35   AST 15 - 41 U/L 18  27  15    ALT 0 - 44 U/L 32  33  23       CT ABDOMEN PELVIS W CONTRAST CLINICAL DATA:  Left lower quadrant abdominal pain.  EXAM: CT ABDOMEN AND PELVIS WITH CONTRAST  TECHNIQUE: Multidetector CT imaging of the abdomen and pelvis was performed using the standard protocol following bolus administration of intravenous contrast.  RADIATION DOSE REDUCTION: This exam was performed according to the departmental dose-optimization program which includes automated exposure control, adjustment of the mA and/or kV according to patient size and/or use of iterative reconstruction technique.  CONTRAST:   OMNIPAQUE IOHEXOL 300 MG/ML  SOLN  COMPARISON:  None Available.  FINDINGS: Lower chest: There are patchy atelectatic changes in the visualized lung bases. No overt consolidation. No pleural effusion. The heart is normal in size. No pericardial effusion.  Hepatobiliary: The liver is normal in size. Non-cirrhotic configuration. No suspicious mass. These is mild diffuse hepatic steatosis. No intrahepatic or extrahepatic bile duct dilation. No calcified gallstones. Normal gallbladder wall thickness. No pericholecystic inflammatory changes.  Pancreas: Unremarkable. No pancreatic ductal dilatation or surrounding inflammatory changes.  Spleen: Within normal limits. No focal lesion.  Adrenals/Urinary Tract: Adrenal glands are unremarkable. No suspicious renal mass. There several, peripheral, wedge-shaped, heterogeneous relatively hypoattenuating areas in the left renal cortex, reaching up to the surface. These are nonspecific but can be seen as a sequela of chronic versus acute pyelonephritis. Correlate clinically and with urinalysis. No obstructive uropathy or nephroureterolithiasis. Unremarkable urinary bladder.  Stomach/Bowel: There is short segment of proximal sigmoid colon (approximately 4.5-5 cm long), exhibiting moderate irregular wall thickening and pericolonic fat stranding on the background of a diverticulum, compatible with acute uncomplicated diverticulitis. No walled off abscess or loculated collection. No pneumoperitoneum. No disproportionate dilation of small or large bowel loops to suggest bowel obstruction. Unremarkable appendix.  Vascular/Lymphatic: There is trace amount of reactive ascites in the dependent pelvis. No pneumoperitoneum. No abdominal or pelvic lymphadenopathy, by size criteria. No aneurysmal dilation of the major abdominal arteries.  Reproductive: Normal-size anteverted uterus containing a T-shaped intrauterine device, which appears in  satisfactory position. No large adnexal mass seen.  Other: There is a tiny fat containing umbilical hernia. The soft tissues and abdominal wall are otherwise unremarkable.  Musculoskeletal: No suspicious osseous lesions.  IMPRESSION: 1. Acute uncomplicated proximal sigmoid diverticulitis, as described above. 2. There are several peripheral wedge-shaped heterogeneous hypoattenuating areas in the left renal cortex, reaching up to the surface. These are nonspecific but can be seen as a sequela of chronic versus acute pyelonephritis. Correlate clinically and with urinalysis. 3. Multiple other nonacute observations, as described above.  Electronically Signed   By: Jules Schick M.D.   On: 02/15/2023 10:20    Past Medical History:  Diagnosis Date   Anxiety    Back pain    History of chicken pox    History of kidney stones    Past Surgical History:  Procedure Laterality Date   WISDOM TOOTH EXTRACTION     2020   Family History  Problem Relation Age of Onset   Hypertension Mother  Diabetes Mother    Hypertension Father    Pulmonary embolism Sister    Multiple sclerosis Maternal Grandmother    Heart disease Maternal Grandfather    Social History   Tobacco Use   Smoking status: Never   Smokeless tobacco: Never  Vaping Use   Vaping status: Never Used  Substance Use Topics   Alcohol use: Yes    Alcohol/week: 4.0 standard drinks of alcohol    Types: 1 Glasses of wine, 1 Cans of beer, 1 Shots of liquor, 1 Standard drinks or equivalent per week   Drug use: No   Current Outpatient Medications  Medication Sig Dispense Refill   ALPRAZolam (XANAX) 0.25 MG tablet 1-2 tablet po bid prn anxiety 20 tablet 0   fluticasone (FLONASE) 50 MCG/ACT nasal spray Place 2 sprays into both nostrils daily. 16 g 6   levonorgestrel (KYLEENA) 19.5 MG IUD Kyleena 17.5 mcg/24 hrs (12yrs) 19.5mg  intrauterine device  Take 1 device by intrauterine route.     No current facility-administered  medications for this visit.   Allergies  Allergen Reactions   Shellfish Allergy Hives   Pecan Nut (Diagnostic)     Review of Systems: All systems reviewed and negative except where noted in HPI.   Wt Readings from Last 3 Encounters:  02/24/23 202 lb 3.2 oz (91.7 kg)  02/15/23 210 lb (95.3 kg)  06/10/22 221 lb (100.2 kg)    Physical Exam:  BP 110/70   Pulse 97   Ht 5\' 7"  (1.702 m)   Wt 187 lb (84.8 kg)   BMI 29.29 kg/m  Constitutional:  Pleasant, generally well appearing female in no acute distress. Psychiatric:  Normal mood and affect. Behavior is normal. EENT: Pupils normal.  Conjunctivae are normal. No scleral icterus. Neck supple.  Cardiovascular: Normal rate, regular rhythm.  Pulmonary/chest: Effort normal and breath sounds normal. No wheezing, rales or rhonchi. Abdominal: Soft, nondistended, nontender. Bowel sounds active throughout. There are no masses palpable. No hepatomegaly. Neurological: Alert and oriented to person place and time.   Willette Cluster, NP  03/19/2023, 8:41 AM  Cc:  Referring Provider Olive Bass,*

## 2023-03-19 NOTE — Patient Instructions (Addendum)
-   Try to consume 60 oz water daily.  --Start reintroducing high fiber foods into diet  -- Do not strain to have a bowel movement. Can use glycerin suppositories as needed. If needed you can also  try Miralax 1 capful mixed in 8 ounces of water    You have been scheduled for a colonoscopy. Please follow written instructions given to you at your visit today.   Please pick up your prep supplies at the pharmacy within the next 1-3 days.  If you use inhalers (even only as needed), please bring them with you on the day of your procedure.  DO NOT TAKE 7 DAYS PRIOR TO TEST- Trulicity (dulaglutide) Ozempic, Wegovy (semaglutide) Mounjaro (tirzepatide) Bydureon Bcise (exanatide extended release)  DO NOT TAKE 1 DAY PRIOR TO YOUR TEST Rybelsus (semaglutide) Adlyxin (lixisenatide) Victoza (liraglutide) Byetta (exanatide) ___________________________________________________________________________  We have sent the following medications to your pharmacy for you to pick up at your convenience: Suprep   _______________________________________________________  If your blood pressure at your visit was 140/90 or greater, please contact your primary care physician to follow up on this.  _______________________________________________________  If you are age 39 or older, your body mass index should be between 23-30. Your Body mass index is 29.29 kg/m. If this is out of the aforementioned range listed, please consider follow up with your Primary Care Provider.  If you are age 39 or younger, your body mass index should be between 19-25. Your Body mass index is 29.29 kg/m. If this is out of the aformentioned range listed, please consider follow up with your Primary Care Provider.   ________________________________________________________  The [facility] providers would like to encourage you to use [facility] to communicate with providers for non-urgent requests or questions.  Due to long hold times on  the telephone, sending your provider a message by [facility] may be a faster and more efficient way to get a response.  Please allow 48 business hours for a response.  Please remember that this is for non-urgent requests.  _______________________________________________________   Due to recent changes in healthcare laws, you may see the results of your imaging and laboratory studies on MyChart before your provider has had a chance to review them.  We understand that in some cases there may be results that are confusing or concerning to you. Not all laboratory results come back in the same time frame and the provider may be waiting for multiple results in order to interpret others.  Please give [place] 48 hours in order for your provider to thoroughly review all the results before contacting the office for clarification of your results.   Thank you for entrusting me with your care and choosing [facility].  Willette Cluster NP

## 2023-03-27 ENCOUNTER — Ambulatory Visit (AMBULATORY_SURGERY_CENTER): Payer: BC Managed Care – PPO | Admitting: Internal Medicine

## 2023-03-27 ENCOUNTER — Encounter: Payer: Self-pay | Admitting: Internal Medicine

## 2023-03-27 VITALS — BP 112/70 | HR 85 | Temp 97.0°F | Resp 12 | Ht 67.0 in | Wt 187.0 lb

## 2023-03-27 DIAGNOSIS — K5792 Diverticulitis of intestine, part unspecified, without perforation or abscess without bleeding: Secondary | ICD-10-CM

## 2023-03-27 DIAGNOSIS — K5730 Diverticulosis of large intestine without perforation or abscess without bleeding: Secondary | ICD-10-CM | POA: Diagnosis not present

## 2023-03-27 DIAGNOSIS — K5900 Constipation, unspecified: Secondary | ICD-10-CM

## 2023-03-27 MED ORDER — SODIUM CHLORIDE 0.9 % IV SOLN: 500.0000 mL | Freq: Once | INTRAVENOUS | Status: DC

## 2023-03-27 MED ORDER — SODIUM CHLORIDE 0.9 % IV SOLN: 500.0000 mL | Freq: Once | INTRAVENOUS | Status: AC

## 2023-03-27 NOTE — Progress Notes (Signed)
 See office note dated 03/19/2023 for details and current H&P  Patient presenting for colonoscopy to follow-up uncomplicated sigmoid diverticulitis from December 2024  She remains appropriate for colonoscopy in the [facility] today

## 2023-03-27 NOTE — Op Note (Signed)
 [facility] Patient Name: Natalie Montgomery Procedure Date: 03/27/2023 2:06 PM MRN: [id number] Endoscopist: Gordy CHRISTELLA Starch , MD, [id number] Age: 38 Referring MD:  Date of Birth: 12-26-1985 Gender: Female Account #: [id number] Procedure:                Colonoscopy Indications:              Follow-up of diverticulitis (initial episode Dec                            2024), 1st colonoscopy Medicines:                Monitored Anesthesia Care Procedure:                Pre-Anesthesia Assessment:                           - Prior to the procedure, a History and Physical                            was performed, and patient medications and                            allergies were reviewed. The patient's tolerance of                            previous anesthesia was also reviewed. The risks                            and benefits of the procedure and the sedation                            options and risks were discussed with the patient.                            All questions were answered, and informed consent                            was obtained. Prior Anticoagulants: The patient has                            taken no anticoagulant or antiplatelet agents. ASA                            Grade Assessment: II - A patient with mild systemic                            disease. After reviewing the risks and benefits,                            the patient was deemed in satisfactory condition to                            undergo the procedure.  After obtaining informed consent, the colonoscope                            was passed under direct vision. Throughout the                            procedure, the patient's blood pressure, pulse, and                            oxygen saturations were monitored continuously. The                            Olympus Scope SN: [id number] was introduced through                            the anus and advanced to the terminal  ileum. The                            colonoscopy was performed without difficulty. The                            patient tolerated the procedure well. The quality                            of the bowel preparation was good. The terminal                            ileum, ileocecal valve, appendiceal orifice, and                            rectum were photographed. Scope In: 2:11:44 PM Scope Out: 2:25:42 PM Scope Withdrawal Time: 0 hours 10 minutes 58 seconds  Total Procedure Duration: 0 hours 13 minutes 58 seconds  Findings:                 The digital rectal exam was normal.                           The terminal ileum appeared normal.                           Multiple small-mouthed diverticula were found in                            the sigmoid colon.                           The exam was otherwise without abnormality on                            direct and retroflexion views. Complications:            No immediate complications. Estimated Blood Loss:     Estimated blood loss: none. Impression:               - The examined portion of the ileum was  normal.                           - Mild diverticulosis in the sigmoid colon.                           - The examination was otherwise normal on direct                            and retroflexion views.                           - No specimens collected. Recommendation:           - Patient has a contact number available for                            emergencies. The signs and symptoms of potential                            delayed complications were discussed with the                            patient. Return to normal activities tomorrow.                            Written discharge instructions were provided to the                            patient.                           - Resume previous diet.                           - Continue present medications.                           - Repeat colonoscopy in 10 years for screening                             purposes. Gordy CHRISTELLA Starch, MD 03/27/2023 2:28:29 PM This report has been signed electronically.

## 2023-03-27 NOTE — Progress Notes (Signed)
 Sedate, gd SR, tolerated procedure well, VSS, report to RN

## 2023-03-27 NOTE — Progress Notes (Signed)
 Pt's states no medical or surgical changes since previsit or office visit.

## 2023-03-27 NOTE — Patient Instructions (Signed)
 YOU HAD AN ENDOSCOPIC PROCEDURE TODAY AT THE [facility]:   Refer to the procedure report that was given to you for any specific questions about what was found during the examination.  If the procedure report does not answer your questions, please call your gastroenterologist to clarify.  If you requested that your care partner not be given the details of your procedure findings, then the procedure report has been included in a sealed envelope for you to review at your convenience later.  YOU SHOULD EXPECT: Some feelings of bloating in the abdomen. Passage of more gas than usual.  Walking can help get rid of the air that was put into your GI tract during the procedure and reduce the bloating. If you had a lower endoscopy (such as a colonoscopy or flexible sigmoidoscopy) you may notice spotting of blood in your stool or on the toilet paper. If you underwent a bowel prep for your procedure, you may not have a normal bowel movement for a few days.  Please Note:  You might notice some irritation and congestion in your nose or some drainage.  This is from the oxygen used during your procedure.  There is no need for concern and it should clear up in a day or so.  SYMPTOMS TO REPORT IMMEDIATELY:  Following lower endoscopy (colonoscopy or flexible sigmoidoscopy):  Excessive amounts of blood in the stool  Significant tenderness or worsening of abdominal pains  Swelling of the abdomen that is new, acute  Fever of 100F or higher   For urgent or emergent issues, a gastroenterologist can be reached at any hour by calling [phone]. Do not use MyChart messaging for urgent concerns.    DIET:  We do recommend a small meal at first, but then you may proceed to your regular diet.  Drink plenty of fluids but you should avoid alcoholic beverages for 24 hours.  MEDICATIONS: Continue present medications.  FOLLOW UP: Repeat colonoscopy in 10 years.  Please see handouts given to you by your  recovery nurse: Diverticulosis.  Thank you for allowing us  to provide for your healthcare needs today.  ACTIVITY:  You should plan to take it easy for the rest of today and you should NOT DRIVE or use heavy machinery until tomorrow (because of the sedation medicines used during the test).    FOLLOW UP: Our staff will call the number listed on your records the next business day following your procedure.  We will call around 7:15- 8:00 am to check on you and address any questions or concerns that you may have regarding the information given to you following your procedure. If we do not reach you, we will leave a message.     If any biopsies were taken you will be contacted by phone or by letter within the next 1-3 weeks.  Please call us  at [phone] if you have not heard about the biopsies in 3 weeks.    SIGNATURES/CONFIDENTIALITY: You and/or your care partner have signed paperwork which will be entered into your electronic medical record.  These signatures attest to the fact that that the information above on your After Visit Summary has been reviewed and is understood.  Full responsibility of the confidentiality of this discharge information lies with you and/or your care-partner.

## 2023-03-30 ENCOUNTER — Telehealth: Payer: Self-pay | Admitting: *Deleted

## 2023-03-30 NOTE — Telephone Encounter (Signed)
  Follow up Call-     03/27/2023    1:43 PM  Call back number  Post procedure Call Back phone  # [phone]  Permission to leave phone message Yes     Patient questions:  Do you have a fever, pain , or abdominal swelling? No. Pain Score  0 *  Have you tolerated food without any problems? Yes.    Have you been able to return to your normal activities? Yes.    Do you have any questions about your discharge instructions: Diet   No. Medications  No. Follow up visit  No.  Do you have questions or concerns about your Care? No.  Actions: * If pain score is 4 or above: No action needed, pain <4.

## 2023-06-09 ENCOUNTER — Ambulatory Visit: Admitting: Family Medicine

## 2023-06-09 ENCOUNTER — Ambulatory Visit (INDEPENDENT_AMBULATORY_CARE_PROVIDER_SITE_OTHER)

## 2023-06-09 VITALS — BP 136/88 | HR 70 | Ht 67.0 in

## 2023-06-09 DIAGNOSIS — M79672 Pain in left foot: Secondary | ICD-10-CM

## 2023-06-09 DIAGNOSIS — S92355A Nondisplaced fracture of fifth metatarsal bone, left foot, initial encounter for closed fracture: Secondary | ICD-10-CM | POA: Diagnosis not present

## 2023-06-09 DIAGNOSIS — S92352A Displaced fracture of fifth metatarsal bone, left foot, initial encounter for closed fracture: Secondary | ICD-10-CM | POA: Diagnosis not present

## 2023-06-09 NOTE — Patient Instructions (Addendum)
 Thank you for coming in today.   Wear the CAM walker boot we talked about  Use crutches if needed  Check back in 2 weeks

## 2023-06-09 NOTE — Progress Notes (Unsigned)
 Joanna Muck, PhD, LAT, ATC acting as a scribe for Garlan Juniper, MD.  Natalie Montgomery is a 38 y.o. female who presents to [facility] at [facility] today for L foot pain. Pt was previously seen by Dr. Alease Hunter in 2022 for R knee pain.  Today, pt c/o L foot pain ongoing since Sunday night. Pt suffered a fall when going for a walking, tripping on uneven payment. L ankle went into iNv. Pt locates pain to the lateral aspect of her foot, around the base of the 5th MT  Swelling: yes Treatments tried: elevation, ice, compression, rest, walking on her heel  Pertinent review of systems: No fevers or chills  Relevant historical information: SI joint inflammation. 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  She likes to walk in a row without plans for exercise. Recent history of diverticulitis.  Exam:  BP 136/88   Pulse 70   Ht 5\' 7"  (1.702 m)   SpO2 99%   BMI 29.29 kg/m  General: Well Developed, well nourished, and in no acute distress.   MSK: Left foot: Swollen and tender palpation proximal fifth metatarsal.  Normal foot and ankle motion.    Lab and Radiology Results No results found for this or any previous visit (from the past 72 hours). DG Ankle Complete Left Result Date: 06/09/2023 CLINICAL DATA:  Left foot and ankle pain and swelling for 2 days after fall. EXAM: LEFT ANKLE COMPLETE - 3+ VIEW; LEFT FOOT - COMPLETE 3+ VIEW COMPARISON:  None Available. FINDINGS: Ankle: No acute fracture or dislocation. Ankle mortise is preserved. No ankle joint effusion. There is mild generalized soft tissue edema. Foot: Mildly displaced fracture involving the base of the fifth metatarsal. This is minimally comminuted and extends to the tarsal metatarsal joint. No additional fracture of the foot. The alignment and joint spaces are normal. Mild soft tissue edema. IMPRESSION: 1. Mildly displaced fracture involving the base of the  fifth metatarsal. 2. No acute fracture or dislocation of the ankle. Electronically Signed   By: Chadwick Colonel M.D.   On: 06/09/2023 16:10   DG Foot Complete Left Result Date: 06/09/2023 CLINICAL DATA:  Left foot and ankle pain and swelling for 2 days after fall. EXAM: LEFT ANKLE COMPLETE - 3+ VIEW; LEFT FOOT - COMPLETE 3+ VIEW COMPARISON:  None Available. FINDINGS: Ankle: No acute fracture or dislocation. Ankle mortise is preserved. No ankle joint effusion. There is mild generalized soft tissue edema. Foot: Mildly displaced fracture involving the base of the fifth metatarsal. This is minimally comminuted and extends to the tarsal metatarsal joint. No additional fracture of the foot. The alignment and joint spaces are normal. Mild soft tissue edema. IMPRESSION: 1. Mildly displaced fracture involving the base of the fifth metatarsal. 2. No acute fracture or dislocation of the ankle. Electronically Signed   By: Chadwick Colonel M.D.   On: 06/09/2023 16:10  I, Garlan Juniper, personally (independently) visualized and performed the interpretation of the images attached in this note.      Assessment and Plan: 38 y.o. female with left fifth metatarsal fracture.  This is more of an avulsion type.  If fracture does not involve the metaphysis (location for Jones fracture).  Will treat with CAM Walker boot.  Weightbearing as tolerated expect initially crutches at first.  She has both crutches and a cam walker boot at home.  Consider a knee scooter as well.  Recheck in 2  weeks.   PDMP not reviewed this encounter. Orders Placed This Encounter  Procedures   DG Ankle Complete Left    Standing Status:   Future    Number of Occurrences:   1    Expiration Date:   06/08/2024    Reason for Exam (SYMPTOM  OR DIAGNOSIS REQUIRED):   left foot pain    Preferred imaging location?:   [facility]    Is patient pregnant?:   No   DG Foot Complete Left    Standing Status:   Future    Number of Occurrences:   1     Expiration Date:   06/08/2024    Reason for Exam (SYMPTOM  OR DIAGNOSIS REQUIRED):   left foot pain    Preferred imaging location?:   [facility]    Is patient pregnant?:   No   No orders of the defined types were placed in this encounter.    Discussed warning signs or symptoms. Please see discharge instructions. Patient expresses understanding.   The above documentation has been reviewed and is accurate and complete Garlan Juniper, M.D.

## 2023-06-10 ENCOUNTER — Encounter: Payer: Self-pay | Admitting: Family Medicine

## 2023-06-10 DIAGNOSIS — S92309A Fracture of unspecified metatarsal bone(s), unspecified foot, initial encounter for closed fracture: Secondary | ICD-10-CM | POA: Insufficient documentation

## 2023-06-10 NOTE — Progress Notes (Signed)
Left ankle x-ray shows no fracture.

## 2023-06-10 NOTE — Progress Notes (Signed)
 Left foot x-ray shows a fracture at the base of the fifth metatarsal.  This is an avulsion type fracture.  This is just what we looked at in clinic yesterday.

## 2023-06-11 ENCOUNTER — Encounter: Payer: Self-pay | Admitting: Family Medicine

## 2023-06-11 NOTE — Telephone Encounter (Signed)
 Forwarding to Dr. Denyse Amass to review and advise.

## 2023-06-23 ENCOUNTER — Ambulatory Visit (INDEPENDENT_AMBULATORY_CARE_PROVIDER_SITE_OTHER)

## 2023-06-23 ENCOUNTER — Ambulatory Visit: Admitting: Family Medicine

## 2023-06-23 ENCOUNTER — Encounter: Payer: Self-pay | Admitting: Family Medicine

## 2023-06-23 VITALS — BP 112/80 | HR 105 | Ht 67.0 in

## 2023-06-23 DIAGNOSIS — S92355D Nondisplaced fracture of fifth metatarsal bone, left foot, subsequent encounter for fracture with routine healing: Secondary | ICD-10-CM

## 2023-06-23 DIAGNOSIS — S92352D Displaced fracture of fifth metatarsal bone, left foot, subsequent encounter for fracture with routine healing: Secondary | ICD-10-CM | POA: Diagnosis not present

## 2023-06-23 DIAGNOSIS — S61432A Puncture wound without foreign body of left hand, initial encounter: Secondary | ICD-10-CM

## 2023-06-23 DIAGNOSIS — S92202D Fracture of unspecified tarsal bone(s) of left foot, subsequent encounter for fracture with routine healing: Secondary | ICD-10-CM | POA: Diagnosis not present

## 2023-06-23 DIAGNOSIS — Z23 Encounter for immunization: Secondary | ICD-10-CM | POA: Diagnosis not present

## 2023-06-23 DIAGNOSIS — S92902D Unspecified fracture of left foot, subsequent encounter for fracture with routine healing: Secondary | ICD-10-CM | POA: Diagnosis not present

## 2023-06-23 NOTE — Patient Instructions (Addendum)
 Thank you for coming in today.   Recheck in about 1 month.   Please get an Xray today before you leave   Tdap today for puncture wound

## 2023-06-23 NOTE — Progress Notes (Unsigned)
   I, Miquel Amen, CMA acting as a scribe for Garlan Juniper, MD.  Natalie Montgomery is a 38 y.o. female who presents to [facility] at [facility] today for 2-wk f/u L 5th MT fx. Pt was last seen by Dr. Alease Hunter on 06/09/23 and was advised to wear a CAM walker boot and weightbearing as tolerated. Consider a knee scooter.  Today, pt reports gradual improvement over time, improvement has started to slow down. Denies swelling. In walking boot daily. Using rolling scooter. Was able to walk on the heel to the bathroom with no pain. No meds currently.   Dx imaging: 06/09/23 L foot & ankle XR  Pertinent review of systems: ***  Relevant historical information: ***   Exam:  There were no vitals taken for this visit. General: Well Developed, well nourished, and in no acute distress.   MSK: ***    Lab and Radiology Results No results found for this or any previous visit (from the past 72 hours). No results found.     Assessment and Plan: 38 y.o. female with ***   PDMP not reviewed this encounter. No orders of the defined types were placed in this encounter.  No orders of the defined types were placed in this encounter.    Discussed warning signs or symptoms. Please see discharge instructions. Patient expresses understanding.   ***

## 2023-06-24 NOTE — Progress Notes (Signed)
 Left foot x-ray shows very slight widening of the fracture line.  I think this is due to the edges of the fracture being reabsorbed.  Recommend using the cam walker boot for at least 3 more weeks.

## 2023-06-26 ENCOUNTER — Encounter: Payer: Self-pay | Admitting: Family Medicine

## 2023-06-26 NOTE — Telephone Encounter (Signed)
 Forwarding to Dr. Denyse Amass to review and advise.

## 2023-06-29 NOTE — Telephone Encounter (Signed)
 Forwarding to Dr. Denyse Amass to review and advise.

## 2023-07-27 ENCOUNTER — Encounter: Payer: Self-pay | Admitting: Family Medicine

## 2023-07-27 ENCOUNTER — Ambulatory Visit: Admitting: Family Medicine

## 2023-07-27 ENCOUNTER — Ambulatory Visit (INDEPENDENT_AMBULATORY_CARE_PROVIDER_SITE_OTHER)

## 2023-07-27 VITALS — BP 164/102 | HR 101 | Ht 67.0 in | Wt 189.0 lb

## 2023-07-27 DIAGNOSIS — M79672 Pain in left foot: Secondary | ICD-10-CM | POA: Diagnosis not present

## 2023-07-27 DIAGNOSIS — S92355D Nondisplaced fracture of fifth metatarsal bone, left foot, subsequent encounter for fracture with routine healing: Secondary | ICD-10-CM | POA: Diagnosis not present

## 2023-07-27 NOTE — Patient Instructions (Addendum)
 Thank you for coming in today.   ASO Speed Lacer  Increase activity as tolerated.   Recheck as needed.

## 2023-07-27 NOTE — Progress Notes (Signed)
   I, Miquel Amen, CMA acting as a scribe for Garlan Juniper, MD.  Natalie Montgomery is a 38 y.o. female who presents to [facility] at [facility] today for 38-month f/u f/u L 5th MT fx. Pt was last seen by Dr. Alease Hunter on 06/23/23 and was advised to cont CAM walker boot and gradually transition to post-op shoe.  Today, pt reports L foot is feeling great. She transitioned to the post-op shoe, but it was painful. She has been trying to do short walks, no pain.  She feels great with no significant pain.  She is increasing her activity level now and walked about a mile and normal shoes without hurting.  Dx imaging: 06/23/23 L foot XR 06/09/23 L foot & ankle XR  Pertinent review of systems: No fevers or chills  Relevant historical information: Carpal tunnel syndrome   Exam:  BP (!) 164/102   Pulse (!) 101   Ht 5\' 7"  (1.702 m)   Wt 189 lb (85.7 kg)   SpO2 99%   BMI 29.60 kg/m  General: Well Developed, well nourished, and in no acute distress.   MSK: Left foot normal appearing nontender normal motion intact strength.    Lab and Radiology Results  X-ray images left foot obtained today personally and independently interpreted. Persistent fracture line visible at the proximal fifth metatarsal without significant change in displacement. Await formal radiology review.    Assessment and Plan: 38 y.o. female with left foot proximal fifth metatarsal fracture.  Original injury occurred about 6 weeks ago.  Clinically she is doing great with almost complete resolution of symptoms.  X-ray still shows that the fracture is still healing.  She may have a persistent fibrous union at the end of this.  However if she is doing clinically great she will need other treatment even if she does have a fibrous union.  As she is doing so well she can continue to advance her activity as tolerated and check back as needed.  I am not sure what we are going to do in a month to check in on that  x-ray again if is not fully healed if she is asymptomatic clinically.  Recommend using an ASO ankle brace with activity.   PDMP not reviewed this encounter. Orders Placed This Encounter  Procedures   DG Foot Complete Left    Standing Status:   Future    Number of Occurrences:   1    Expiration Date:   07/26/2024    Reason for Exam (SYMPTOM  OR DIAGNOSIS REQUIRED):   left foot fx    Preferred imaging location?:   [facility]    Is patient pregnant?:   No   No orders of the defined types were placed in this encounter.    Discussed warning signs or symptoms. Please see discharge instructions. Patient expresses understanding.   The above documentation has been reviewed and is accurate and complete Garlan Juniper, M.D.

## 2023-07-29 ENCOUNTER — Ambulatory Visit: Payer: Self-pay | Admitting: Family Medicine

## 2023-07-29 NOTE — Progress Notes (Signed)
 Left foot x-ray shows a stable appearing fracture.

## 2023-09-02 DIAGNOSIS — Z01419 Encounter for gynecological examination (general) (routine) without abnormal findings: Secondary | ICD-10-CM | POA: Diagnosis not present
